# Patient Record
Sex: Male | Born: 1960 | Race: Black or African American | Hispanic: No | Marital: Married | Smoking: Never smoker
Health system: Southern US, Community
[De-identification: ages and names within clinical notes are randomized; demographics above are authoritative.]

## PROBLEM LIST (undated history)

## (undated) DIAGNOSIS — D509 Iron deficiency anemia, unspecified: Secondary | ICD-10-CM

## (undated) DIAGNOSIS — Z8639 Personal history of other endocrine, nutritional and metabolic disease: Secondary | ICD-10-CM

## (undated) DIAGNOSIS — I10 Essential (primary) hypertension: Secondary | ICD-10-CM

## (undated) DIAGNOSIS — D649 Anemia, unspecified: Secondary | ICD-10-CM

## (undated) HISTORY — PX: COLONOSCOPY: SHX174

## (undated) HISTORY — DX: Essential (primary) hypertension: I10

## (undated) HISTORY — DX: Personal history of other endocrine, nutritional and metabolic disease: Z86.39

## (undated) HISTORY — DX: Iron deficiency anemia, unspecified: D50.9

## (undated) HISTORY — DX: Anemia, unspecified: D64.9

---

## 2001-03-16 ENCOUNTER — Encounter: Payer: Self-pay | Admitting: Family Medicine

## 2001-03-16 ENCOUNTER — Ambulatory Visit (HOSPITAL_COMMUNITY): Admission: RE | Admit: 2001-03-16 | Discharge: 2001-03-16 | Payer: Self-pay | Admitting: Family Medicine

## 2006-07-14 ENCOUNTER — Ambulatory Visit (HOSPITAL_COMMUNITY): Admission: RE | Admit: 2006-07-14 | Discharge: 2006-07-14 | Payer: Self-pay | Admitting: Family Medicine

## 2010-06-02 ENCOUNTER — Ambulatory Visit (HOSPITAL_COMMUNITY): Admission: RE | Admit: 2010-06-02 | Discharge: 2010-06-02 | Payer: Self-pay | Admitting: Family Medicine

## 2010-11-18 ENCOUNTER — Ambulatory Visit (INDEPENDENT_AMBULATORY_CARE_PROVIDER_SITE_OTHER): Payer: 59 | Admitting: Internal Medicine

## 2010-11-18 DIAGNOSIS — D509 Iron deficiency anemia, unspecified: Secondary | ICD-10-CM

## 2010-11-30 NOTE — Consult Note (Signed)
NAMESAFI, CULOTTA            ACCOUNT NO.:  [id number]  MEDICAL RECORD NO.:  [id number]          PATIENT TYPE:  OUT  LOCATION:  [facility]DC                           FACILITY:  [facility]  PHYSICIAN:  Lionel December, M.D.    DATE OF BIRTH:  October 08, 1960  DATE OF CONSULTATION: DATE OF DISCHARGE:  06/02/2010                                CONSULTATION   REASON FOR CONSULTATION:  Anemia/TCS.  HISTORY OF PRESENT ILLNESS:  Mr. Ohman is a 50 year old black male referred to our office by Dr. Phillips Odor.  He was recently noted in February that his hemoglobin was slightly low at 11.9 and hematocrit was 36.5, his folate was low at 2.8.  His ferritin was normal at 97, percent saturation was low at 17.  Iron was 54, TIBC was 311, a new IBC was 257. Kimari states his appetite has been good.  There has been no weight loss.  He denies any abdominal pain.  He usually has a bowel movement every day.  His stools are brown.  There has been no change in the size. He denies any melena or bright red rectal bleeding.  He states he is not having any problems.  There is no dysphagia.  No weight loss.  He occasionally has a heartburn.  There is no family history of colon cancer.  October 06, 2010, his hemoglobin was 11.9, HCT 36.5, his MCV was 90.3.  ALLERGIES:  He is allergic to SULFA.  PAST SURGICAL HISTORY:  He has had a left ganglion cyst removed by Dr. Melvern Sample many years ago and he has had a previous cystoscopy.  MEDICAL HISTORY:  He has hypertension.  HOME MEDICATIONS:  Lisinopril 2.5 twice a day, Norvasc 20 mg a day, folate one a day.  FAMILY HISTORY:  His mother is alive with diabetes.  Father is deceased from leukemia.  Two sisters, one has diabetes, one has arthritis.  Four brothers, one has COPD and three in good health.  He is married.  He works at [employer] in [place].  He does not smoke or do drugs. He occasionally drinks alcohol.  He has three children in good health, one, however, does  have depression.  OBJECTIVE:  VITAL SIGNS:  His weight is 217 lb. his height is 6 feet 1 inch, temperature is 97.4, his blood pressure is 124/92, his pulse is 68. HEENT:  He has natural teeth.  His oral mucosa is moist.  There are no lesions.  His conjunctiva is pink.  His sclera is anicteric. NECK:  His thyroid is normal.  There is no cervical lymphadenopathy. LUNGS:  Clear. ABDOMEN:  Soft.  Bowel sounds are positive.  No masses.  I have deferred guaiac in his stools as he will be scheduled for colonoscopy.  ASSESSMENT:  Mr. Ellenbecker is a 50 year old male presenting with very mild anemia but he has no GI symptoms. He is approaching 50. RECOMMENDATIONS:  We will schedule a diagnostic/screening colonoscopy on Mr. Lant.  The risk and benefits were reviewed with the patient and he is agreeable to proceed with the procedure.    ______________________________ Dorene Ar, NP   ______________________________ Lionel December, M.D.  TS/MEDQ  D:  11/18/2010  T:  11/18/2010  Job:  [id number]  Electronically Signed by Dorene Ar PA on 11/25/2010 08:28:01 AM Electronically Signed by Lionel December M.D. on 11/30/2010 12:42:23 PM

## 2011-01-15 ENCOUNTER — Ambulatory Visit (HOSPITAL_COMMUNITY)
Admission: RE | Admit: 2011-01-15 | Discharge: 2011-01-15 | Disposition: A | Discharge status: Home / Self Care | Payer: 59 | Source: Ambulatory Visit | Attending: Internal Medicine | Admitting: Internal Medicine

## 2011-01-15 ENCOUNTER — Encounter (HOSPITAL_BASED_OUTPATIENT_CLINIC_OR_DEPARTMENT_OTHER): Payer: 59 | Admitting: Internal Medicine

## 2011-01-15 ENCOUNTER — Other Ambulatory Visit (INDEPENDENT_AMBULATORY_CARE_PROVIDER_SITE_OTHER): Payer: Self-pay | Admitting: Internal Medicine

## 2011-01-15 DIAGNOSIS — D126 Benign neoplasm of colon, unspecified: Secondary | ICD-10-CM | POA: Insufficient documentation

## 2011-01-15 DIAGNOSIS — I10 Essential (primary) hypertension: Secondary | ICD-10-CM | POA: Insufficient documentation

## 2011-01-15 DIAGNOSIS — Z1211 Encounter for screening for malignant neoplasm of colon: Secondary | ICD-10-CM

## 2011-01-15 DIAGNOSIS — D649 Anemia, unspecified: Secondary | ICD-10-CM

## 2011-01-15 DIAGNOSIS — Z79899 Other long term (current) drug therapy: Secondary | ICD-10-CM | POA: Insufficient documentation

## 2011-01-15 LAB — HEMOGLOBIN AND HEMATOCRIT, BLOOD
HCT: 37.2 % — ABNORMAL LOW (ref 39.0–52.0)
Hemoglobin: 12.1 g/dL — ABNORMAL LOW (ref 13.0–17.0)

## 2011-02-09 NOTE — Op Note (Signed)
  NAMEARCHER, MOIST            ACCOUNT NO.:  [id number]  MEDICAL RECORD NO.:  [id number]           PATIENT TYPE:  O  LOCATION:  [facility]                          FACILITY:  [facility]  PHYSICIAN:  Lionel December, M.D.    DATE OF BIRTH:  November 08, 1960  DATE OF PROCEDURE:  01/15/2011 DATE OF DISCHARGE:                              OPERATIVE REPORT   PROCEDURE:  Colonoscopy.  INDICATION:  Jeff Liu is a 50 year old African American male soon to be 67, who was under colonoscopy primarily for screening purposes.  He was recently evaluated for anemia.  His ferritin was normal, but his iron saturation was slow and he also had a folate deficiency.  There is no history of melena or rectal bleeding and he does not use any NSAIDs.  He uses NSAIDs occasionally.  FAMILY HISTORY:  Negative for colorectal carcinoma.  Procedure risks were reviewed with the patient and informed consent was obtained.  MEDICATIONS FOR CONSCIOUS SEDATION: 1. Demerol 50 mg IV. 2. Versed 6 mg IV.  FINDINGS:  Procedure performed in endoscopy suite.  The patient's vital signs and O2 sats were monitored during the procedure and remained stable.  The patient was placed in left lateral recumbent position and rectal examination performed.  No abnormality noted on external or digital exam.  Pentax videoscope was placed through rectum and advanced under vision into sigmoid colon beyond.  Preparation was excellent. Scope was passed into cecum which was identified by appendiceal orifice and ileocecal valve.  Pictures were taken for the record.  As the scope was withdrawn, colonic mucosa was carefully examined.  There was a 3-4 mm polyp at proximal transverse colon which was ablated via cold biopsy. There was another small polyp at distal transverse colon which was ablated via cold biopsy and submitted in same container.  Mucosa and rest of the colon was normal.  Rectal mucosa was normal.  Scope was retroflexed to examine anorectal  junction which was unremarkable. Endoscope was withdrawn.  Withdrawal time was 16 minutes.  The patient tolerated the procedure well.  FINAL DIAGNOSES: 1. Examination performed to cecum. 2. Two small polyps ablated via cold biopsy from the transverse colon     and submitted in one container.  RECOMMENDATIONS:  We will check his H and H today.  He will resume his usual meds and diet. I will be contacting him with results of biopsy and H and H.          ______________________________ Lionel December, M.D.     NR/MEDQ  D:  01/15/2011  T:  01/15/2011  Job:  [id number]  cc:   Dr. Phillips Odor  Electronically Signed by Lionel December M.D. on 02/09/2011 10:19:24 AM

## 2011-04-24 ENCOUNTER — Encounter (HOSPITAL_COMMUNITY): Payer: Self-pay | Admitting: Oncology

## 2011-04-24 ENCOUNTER — Encounter (HOSPITAL_COMMUNITY): Payer: 59 | Attending: Oncology | Admitting: Oncology

## 2011-04-24 VITALS — BP 130/82 | HR 61 | Temp 98.6°F | Ht 73.0 in | Wt 212.1 lb

## 2011-04-24 DIAGNOSIS — I10 Essential (primary) hypertension: Secondary | ICD-10-CM | POA: Insufficient documentation

## 2011-04-24 DIAGNOSIS — D649 Anemia, unspecified: Secondary | ICD-10-CM | POA: Insufficient documentation

## 2011-04-24 DIAGNOSIS — E538 Deficiency of other specified B group vitamins: Secondary | ICD-10-CM | POA: Insufficient documentation

## 2011-04-24 LAB — CBC
HCT: 37.5 % — ABNORMAL LOW (ref 39.0–52.0)
Hemoglobin: 12.2 g/dL — ABNORMAL LOW (ref 13.0–17.0)
MCHC: 32.5 g/dL (ref 30.0–36.0)
MCV: 90.6 fL (ref 78.0–100.0)
WBC: 6.9 10*3/uL (ref 4.0–10.5)

## 2011-04-24 LAB — DIFFERENTIAL
Lymphocytes Relative: 32 % (ref 12–46)
Monocytes Absolute: 0.7 10*3/uL (ref 0.1–1.0)
Monocytes Relative: 10 % (ref 3–12)
Neutro Abs: 3.7 10*3/uL (ref 1.7–7.7)

## 2011-04-24 LAB — COMPREHENSIVE METABOLIC PANEL
Albumin: 4 g/dL (ref 3.5–5.2)
BUN: 13 mg/dL (ref 6–23)
Chloride: 100 mEq/L (ref 96–112)
Creatinine, Ser: 1.31 mg/dL (ref 0.50–1.35)
GFR calc Af Amer: >60 mL/min (ref >60)
GFR calc non Af Amer: 58 mL/min — ABNORMAL LOW (ref >60)
Glucose, Bld: 94 mg/dL (ref 70–99)
Total Bilirubin: 0.9 mg/dL (ref 0.3–1.2)

## 2011-04-24 LAB — URIC ACID: Uric Acid, Serum: 7.2 mg/dL (ref 4.0–7.8)

## 2011-04-24 LAB — RETICULOCYTES
RBC.: 4.14 MIL/uL — ABNORMAL LOW (ref 4.22–5.81)
Retic Ct Pct: 1.5 % (ref 0.4–3.1)

## 2011-04-24 LAB — SEDIMENTATION RATE: Sed Rate: >140 mm/hr — ABNORMAL HIGH (ref 0–16)

## 2011-04-24 NOTE — Patient Instructions (Signed)
[facility]  Discharge Instructions  RECOMMENDATIONS MADE BY THE CONSULTANT AND ANY TEST RESULTS WILL BE SENT TO YOUR REFERRING DOCTOR.      SPECIAL INSTRUCTIONS/FOLLOW-UP:  Lab work today. We will call you if there are any abnormal results. Return to clinic end of November for labs and then to see MD again.   I acknowledge that I have been informed and understand all the instructions given to me and received a copy. I do not have any more questions at this time, but understand that I may call the Specialty Clinic at [facility] at [phone] during business hours should I have any further questions or need assistance in obtaining follow-up care.    __________________________________________  _____________  __________ Signature of Patient or Authorized Representative            Date                   Time    __________________________________________ Nurse's Signature

## 2011-04-24 NOTE — Progress Notes (Signed)
CC:   Jeff Liu, M.D.  DIAGNOSES: 1. Anemia, normocytic, unclear as to etiology. 2. History of possible gout in the left toe. 3. Hypertension. 4. Folic acid deficiency, nicely documented in January 2012 by Dr.     Phillips Odor and associates with a folic acid level of 2.8, and the     deficient range is 0.4 to 3.3 ng/mL.  At that time his hemoglobin     was 11.9 g.  His hematocrit was 36.5%, and his MCV was normal at     90.  White count, platelets were also normal at that time.  CMET     was also unremarkable at that time.  Cholesterol was normal at that     time as well as a PSA. 5. Plantar fasciitis.  This gentleman is here today.  He is 50 years old.  He has been found to be anemic.  His most recent hemoglobin was 12.1 on 01/15/2011. Hematocrit 37.2.  The normal range for a male his age is 41 to 17 gm/dL for the hemoglobin.  He has not lost blood, and he has seen Dr. Karilyn Cota in consultation who found a benign colon polyp.  REVIEW OF SYSTEMS:  Really is negative.  He does not crave ice or have pica.  He could not figure out why he had the folic acid deficiency.  He does not eat, however, a lot of vegetables and salads, at least he did not at the time, and I suspect that was the cause.  He does not drink, does not smoke, otherwise in very good health.  He works out a lot, lifts a lot of [employer].  He does not walk a lot except at work.  He grew up here in the [place] area.  Played football and basketball. Now works for [employer] in [place].  He is accompanied by his wife of many years.  They have children together who are all grown.  Two sons are grown and gone, 1 daughter 52, still at home.  Everybody is healthy.  He has several brothers, 1 of whom has COPD.  His father died of acute leukemia.  Mother still alive and in good health.  He is not aware of anybody with anemia.  He was also tested for sickle cell disease that was negative.  His review of systems  otherwise is absolutely noncontributory.  PHYSICAL EXAMINATION:  Very pleasant gentleman in no acute distress. Looks his stated age.  He is 212 pounds, height 6 feet 1 inches.  His BMI is 28.  His blood pressure 130/82.  He is afebrile.  Skin is warm and dry to the touch.  His respirations are 16 and unlabored, pulse 60 and regular.  His big toe on the left is what was affected by the pain in January which was severe but was never swollen he states, just extremely painful to the point that he could not sleep with the sheet over it, he could not walk on it and Motrin helped take away the pain after several days.  He has new shoes and since then his plantar fascitis which has been an issue for him.  His plantar fasciitis has resolved essentially as long as he wears these good, well-protected shoes he states.  His story for the big toe being involved by gout, however, is really very striking, with tremendous pain, could not walk on it, could not have anything touch it, could not have that sheet over it at night, etc.  Otherwise he has no lymphadenopathy.  No thyromegaly. He is in no acute distress.  Teeth are in good repair.  Tongue is normal in the midline.  Throat is clear.  Pupils equally round and reactive to light.  He has clear lung fields to auscultation and percussion.  Heart: Regular rhythm and rate without murmur, rub or gallop.  Abdomen:  No hepatosplenomegaly or masses.  There was no distension, no obvious ascites.  He has no peripheral edema.  Pulses 1 to 2+ and symmetrical. His joints are really unremarkable today except for slight tenderness to palpation at the first metatarsal phalangeal joint, but again no redness, no swelling.  He looks really quite good, and I am not sure why he is anemic, so I would like to check a sed rate, reticulocytes, liver enzymes again, kidney function again and sed rate to see what we find, including a uric acid and then see him back in 3  months.  I would like to stop the iron, stop the folic acid and see what is he is like in 3 months with a CBC and folic acid.  His laboratory studies in January did not show any evidence of iron deficiency.  We will see him then.  He is fine with this plan, and will go from there.    ______________________________ [name]. [name], MD ESN/MEDQ  D:  04/24/2011  T:  04/24/2011  Job:  [id number]

## 2011-04-24 NOTE — Progress Notes (Signed)
This office note has been dictated.

## 2011-04-25 LAB — IRON AND TIBC
Saturation Ratios: 21 % (ref 20–55)
UIBC: 254 ug/dL (ref 125–400)

## 2011-04-25 LAB — FERRITIN: Ferritin: 85 ng/mL (ref 22–322)

## 2011-04-25 LAB — FOLATE: Folate: 16.2 ng/mL

## 2011-04-28 ENCOUNTER — Other Ambulatory Visit (HOSPITAL_COMMUNITY): Payer: Self-pay | Admitting: Oncology

## 2011-04-28 DIAGNOSIS — D649 Anemia, unspecified: Secondary | ICD-10-CM

## 2011-04-29 ENCOUNTER — Encounter (HOSPITAL_COMMUNITY): Payer: 59 | Attending: Oncology

## 2011-04-29 DIAGNOSIS — D649 Anemia, unspecified: Secondary | ICD-10-CM | POA: Insufficient documentation

## 2011-04-29 LAB — SEDIMENTATION RATE: Sed Rate: 20 mm/hr — ABNORMAL HIGH (ref 0–16)

## 2011-04-29 NOTE — Progress Notes (Signed)
Labs drawn today for esr,ana, crp, rf, spep

## 2011-04-30 ENCOUNTER — Telehealth (HOSPITAL_COMMUNITY): Payer: Self-pay

## 2011-04-30 ENCOUNTER — Encounter (HOSPITAL_COMMUNITY): Payer: Self-pay

## 2011-04-30 LAB — ANA: Anti Nuclear Antibody(ANA): NEGATIVE

## 2011-04-30 NOTE — Telephone Encounter (Signed)
Notified that repeated labs ESR,RF CRP not alarming per Dr. [name].

## 2011-05-01 LAB — PROTEIN ELECTROPHORESIS, SERUM
Albumin ELP: 53.6 % — ABNORMAL LOW (ref 55.8–66.1)
Beta Globulin: 5.8 % (ref 4.7–7.2)
Total Protein ELP: 7.8 g/dL (ref 6.0–8.3)

## 2011-05-12 ENCOUNTER — Telehealth (HOSPITAL_COMMUNITY): Payer: Self-pay

## 2011-05-12 NOTE — Telephone Encounter (Signed)
Patient called to inform of lab results and treatment plan.  Return appointment confirmed.

## 2011-07-24 ENCOUNTER — Other Ambulatory Visit (HOSPITAL_COMMUNITY): Payer: 59

## 2011-07-24 ENCOUNTER — Encounter (HOSPITAL_COMMUNITY): Payer: 59 | Attending: Oncology

## 2011-07-24 DIAGNOSIS — D649 Anemia, unspecified: Secondary | ICD-10-CM | POA: Insufficient documentation

## 2011-07-24 LAB — CBC
Platelets: 252 10*3/uL (ref 150–400)
RDW: 13.7 % (ref 11.5–15.5)
WBC: 4.6 10*3/uL (ref 4.0–10.5)

## 2011-07-24 LAB — FOLATE: Folate: >20 ng/mL

## 2011-07-24 NOTE — Progress Notes (Signed)
Jeff Liu presented for labwork. Labs per MD order drawn via Peripheral Line 25 gauge needle inserted in rt arm  Good blood return present. Procedure without incident.  Needle removed intact. Patient tolerated procedure well.

## 2011-07-28 ENCOUNTER — Ambulatory Visit (HOSPITAL_COMMUNITY): Payer: 59 | Admitting: Oncology

## 2011-07-30 ENCOUNTER — Encounter (HOSPITAL_COMMUNITY): Payer: Self-pay | Admitting: Oncology

## 2011-07-30 ENCOUNTER — Encounter (HOSPITAL_COMMUNITY): Payer: 59 | Attending: Oncology | Admitting: Oncology

## 2011-07-30 VITALS — BP 137/98 | HR 61 | Temp 97.4°F | Wt 215.3 lb

## 2011-07-30 DIAGNOSIS — D649 Anemia, unspecified: Secondary | ICD-10-CM

## 2011-07-30 DIAGNOSIS — I10 Essential (primary) hypertension: Secondary | ICD-10-CM

## 2011-07-30 DIAGNOSIS — Z8639 Personal history of other endocrine, nutritional and metabolic disease: Secondary | ICD-10-CM

## 2011-07-30 HISTORY — DX: Essential (primary) hypertension: I10

## 2011-07-30 HISTORY — DX: Anemia, unspecified: D64.9

## 2011-07-30 HISTORY — DX: Personal history of other endocrine, nutritional and metabolic disease: Z86.39

## 2011-07-30 NOTE — Patient Instructions (Signed)
[facility]  Discharge Instructions  RECOMMENDATIONS MADE BY THE CONSULTANT AND ANY TEST RESULTS WILL BE SENT TO YOUR REFERRING DOCTOR.   EXAM FINDINGS BY MD TODAY AND SIGNS AND SYMPTOMS TO REPORT TO CLINIC OR PRIMARY MD:   Exam good.  Labs 3 months and then see Dr. [name].  I acknowledge that I have been informed and understand all the instructions given to me and received a copy. I do not have any more questions at this time, but understand that I may call the Specialty Clinic at [facility] at [phone] during business hours should I have any further questions or need assistance in obtaining follow-up care.    __________________________________________  _____________  __________ Signature of Patient or Authorized Representative            Date                   Time    __________________________________________ Nurse's Signature

## 2011-07-30 NOTE — Progress Notes (Signed)
Jeff Ribas, MD [address] [place] [address]  1. Anemia  amLODipine (NORVASC) 2.5 MG tablet, CBC, Differential, Sedimentation rate, Folate, Ferritin, Iron and TIBC, Reticulocytes  2. Normocytic anemia    3. HTN (hypertension)    4. History of folic acid deficiency (non-anemic)      CURRENT THERAPY: Observation  INTERVAL HISTORY: Jeff Liu 50 y.o. male returns for  regular  visit for followup of normocytic anemia of unclear etiology.  On his previous followup visit, Dr. [name] recommended he place his iron and folic acid pills on hold.  The patient reports that he did but his iron pills on hold but continued is folic acid.  I've asked him today to discontinue his folic acid as well.  Otherwise the patient is doing well. He denies any complaints at this point time.  I personally reviewed and went over laboratory results with the patient.  His hemoglobin remains stable, and if anything has improved since May of 2012. Hemoglobin was recently his 12.4. Specimen time patient going over the role of hemoglobin in the body. He understands that lower limits of normal is 13 and he is nearly there. As result the hemoglobin 12.4 should not cause any increased shortness of breath or fatigue.  Patient denies seeing any blood in his stool. He denies any black tarry stool. He denies any hematuria. Denies any epistaxis. He denies any spontaneous bleeding or easy bruisability.   Past Medical History  Diagnosis Date  . Hypertension   . Iron deficiency anemia   . Normocytic anemia 07/30/2011  . HTN (hypertension) 07/30/2011  . History of folic acid deficiency (non-anemic) 07/30/2011    has Normocytic anemia; HTN (hypertension); and History of folic acid deficiency (non-anemic) on his problem list.     is allergic to sulfa drugs cross reactors.  Mr. Monks does not currently have medications on file.  No past surgical history on file.  Ddenies any  headaches, dizziness, double vision, fevers, chills, night sweats, nausea, vomiting, diarrhea, constipation, chest pain, heart palpitations, shortness of breath, blood in stool, black tarry stool, urinary pain, urinary burning, urinary frequency, hematuria.   PHYSICAL EXAMINATION  ECOG PERFORMANCE STATUS: 0 - Asymptomatic  Filed Vitals:   07/30/11 1200  BP: 137/98  Pulse: 61  Temp: 97.4 F (36.3 C)    GENERAL:alert, no distress, well nourished, well developed, comfortable, cooperative and smiling SKIN: skin color, texture, turgor are normal HEAD: Normocephalic EYES: normal EARS: External ears normal OROPHARYNX:mucous membranes are moist  NECK: trachea midline LYMPH:  not examined BREAST:not examined LUNGS: clear to auscultation  HEART: regular rate & rhythm, no murmurs, no gallops, S1 normal and S2 normal ABDOMEN:abdomen soft, non-tender and normal bowel sounds BACK: Back symmetric, no curvature. EXTREMITIES:less then 2 second capillary refill, no joint deformities, effusion, or inflammation, no edema, no skin discoloration, no clubbing, no cyanosis  NEURO: alert & oriented x 3 with fluent speech, no focal motor/sensory deficits, gait normal   LABORATORY DATA: CBC    Component Value Date/Time   WBC 4.6 07/24/2011 0848   RBC 4.12* 07/24/2011 0848   HGB 12.4* 07/24/2011 0848   HCT 37.6* 07/24/2011 0848   PLT 252 07/24/2011 0848   MCV 91.3 07/24/2011 0848   MCH 30.1 07/24/2011 0848   MCHC 33.0 07/24/2011 0848   RDW 13.7 07/24/2011 0848   LYMPHSABS 2.2 04/24/2011 1517   MONOABS 0.7 04/24/2011 1517   EOSABS 0.3 04/24/2011 1517   BASOSABS 0.1 04/24/2011 1517  Chemistry      Component Value Date/Time   NA 140 04/24/2011 1517   K 3.7 04/24/2011 1517   CL 100 04/24/2011 1517   CO2 31 04/24/2011 1517   BUN 13 04/24/2011 1517   CREATININE 1.31 04/24/2011 1517      Component Value Date/Time   CALCIUM 9.9 04/24/2011 1517   ALKPHOS 61 04/24/2011 1517   AST 23 04/24/2011 1517     ALT 15 04/24/2011 1517   BILITOT 0.9 04/24/2011 1517         ASSESSMENT:  1. Anemia, normocytic, unclear as to etiology.  2. History of possible gout in the left toe.  3. Hypertension.  4. Folic acid deficiency, nicely documented in January 2012 by Dr. Phillips Odor and associates with a folic acid level of 2.8, and the deficient range is 0.4 to 3.3 ng/mL. At that time his hemoglobin was 11.9 g. His hematocrit was 36.5%, and his MCV was normal at 90. White count, platelets were also normal at that time. CMET was also unremarkable at that time. Cholesterol was normal at that time as well as a PSA.  5. Plantar fasciitis.   PLAN:  1. Hold oral Iron 2. Hold Folic acid 3. Lab work in 3 months: CBC diff, Retic, Ferritin, Iron/TIBC, ESR, CMET 4. Return in 3 months for follow-up.   All questions were answered. The patient knows to call the clinic with any problems, questions or concerns. We can certainly see the patient much sooner if necessary.   I spent 20 minutes counseling the patient face to face. The total time spent in the appointment was 25 minutes.  KEFALAS,THOMAS

## 2011-10-27 ENCOUNTER — Other Ambulatory Visit (HOSPITAL_COMMUNITY): Payer: 59

## 2011-10-30 ENCOUNTER — Encounter (HOSPITAL_COMMUNITY): Payer: 59 | Attending: Oncology | Admitting: Oncology

## 2011-10-30 ENCOUNTER — Ambulatory Visit (HOSPITAL_COMMUNITY): Payer: 59 | Admitting: Oncology

## 2011-10-30 ENCOUNTER — Encounter (HOSPITAL_COMMUNITY): Payer: Self-pay | Admitting: Oncology

## 2011-10-30 ENCOUNTER — Telehealth (HOSPITAL_COMMUNITY): Payer: Self-pay

## 2011-10-30 VITALS — BP 151/89 | HR 56 | Temp 98.0°F | Wt 216.0 lb

## 2011-10-30 DIAGNOSIS — M79609 Pain in unspecified limb: Secondary | ICD-10-CM

## 2011-10-30 DIAGNOSIS — D649 Anemia, unspecified: Secondary | ICD-10-CM | POA: Insufficient documentation

## 2011-10-30 LAB — CBC
HCT: 36.5 % — ABNORMAL LOW (ref 39.0–52.0)
Hemoglobin: 12.1 g/dL — ABNORMAL LOW (ref 13.0–17.0)
MCH: 30 pg (ref 26.0–34.0)
RBC: 4.04 MIL/uL — ABNORMAL LOW (ref 4.22–5.81)

## 2011-10-30 LAB — SEDIMENTATION RATE: Sed Rate: 27 mm/hr — ABNORMAL HIGH (ref 0–16)

## 2011-10-30 NOTE — Telephone Encounter (Signed)
Patient notified.  Knows to call if unusual symptoms occur.

## 2011-10-30 NOTE — Patient Instructions (Signed)
Jeff Liu  [id number] 1960/12/02   [facility]  Discharge Instructions  RECOMMENDATIONS MADE BY THE CONSULTANT AND ANY TEST RESULTS WILL BE SENT TO YOUR REFERRING DOCTOR.   EXAM FINDINGS BY MD TODAY AND SIGNS AND SYMPTOMS TO REPORT TO CLINIC OR PRIMARY MD: Hemoglobin and Hematocrit are the same as in November.  We will check some labs today.  Let us know if you have any unusual symptoms and if you have this pain and have any swelling in your toes, feet try to come in so we can see you.  MEDICATIONS PRESCRIBED: none     SPECIAL INSTRUCTIONS/FOLLOW-UP: Lab work Needed today and Return to Clinic in 3 months to see MD.   I acknowledge that I have been informed and understand all the instructions given to me and received a copy. I do not have any more questions at this time, but understand that I may call the Specialty Clinic at [facility] at [phone] during business hours should I have any further questions or need assistance in obtaining follow-up care.    __________________________________________  _____________  __________ Signature of Patient or Authorized Representative            Date                   Time    __________________________________________ Nurse's Signature

## 2011-10-30 NOTE — Progress Notes (Signed)
CC:   Corrie Mckusick, M.D.  DIAGNOSIS: 1. Normocytic anemia with an intermittently elevated sedimentation     rate and CRP. 2. Folic acid deficiency which has been corrected. 3. Some weakness especially after he does lunges in his workout. He     can do 20 lunges and gets very tired.  Otherwise he does a lot of     weights and  other fitness. 4. Left big toe pain for 5-7 days, treated with anti-inflammatories on     his own with eventual resolution.  No recurrence and he was told he     either had gout or plantar fascitis but really was in the big toe     at the 1st metatarsal phalangeal joint.  He has not had a     recurrence.  HISTORY:  Emit is working full time.  No fevers.  No night sweats. No chills.  Good appetite.  Good activity level.  He does tell me about the lunges and how he gets fatigue after.  His sense of well being, though, is good.  So he does not have any unusual issues going on.  No unusual complaints on review of systems.  I did not examine him further today since he is asymptomatic, but I do want to review a CBC and a sedimentation rate today.  He did have a CBC on the 1st of February by Dr. Phillips Odor and that showed a hemoglobin 12.4 g, hematocrit 30.1, so he still mildly anemic for a gentleman his age but he had a CMET which was perfectly normal.  He had a lipid panel which was quite good as well.  His total cholesterol is not mentioned but his cholesterol HDL ratio is 3.4, HDL 60.  I do want to see him in 3 months.  I am not sure what is going on with this gentleman.  If he has anything untoward going on in the future, he is to come back and see me but right now we will just get a CBC and sedimentation rate today but I will see him in 3 months.    ______________________________ [name]. [name], MD ESN/MEDQ  D:  10/30/2011  T:  10/30/2011  Job:  [id number]

## 2011-10-30 NOTE — Telephone Encounter (Signed)
Message copied by Sterling Big on Fri Oct 30, 2011 12:41 PM ------      Message from: [name], ERIC S      Created: Fri Oct 30, 2011 11:51 AM       Let him know his cbc is stable but sed rate min. Raised-still not clear why.

## 2011-10-30 NOTE — Progress Notes (Signed)
This office note has been dictated.

## 2011-12-04 ENCOUNTER — Telehealth (HOSPITAL_COMMUNITY): Payer: Self-pay

## 2011-12-04 NOTE — Telephone Encounter (Signed)
Patient is traveling and will not be back in [place] until later tonight so cannot come today for blood work.  Wants to know if he can start the aleve and come for blood work on Monday?  Has had this discomfort all week.

## 2011-12-04 NOTE — Telephone Encounter (Signed)
Spoke with patient and told him he could take the aleve as recommended by Dr. [name] and to let us know on Monday how he's doing.  Patient states that he's already been taking Aleve for sinus problem and will continue with that instead.  He will contact our office on Monday to let us know how he's doing.

## 2011-12-31 IMAGING — CR DG FOOT COMPLETE 3+V*L*
3 series · 3 of 3 positions shown · non-contrast
Comparison: None

CLINICAL DATA: Pain left great toe and left heel

LEFT FOOT - COMPLETE 3+ VIEW

[view not recorded (1 of 3)]
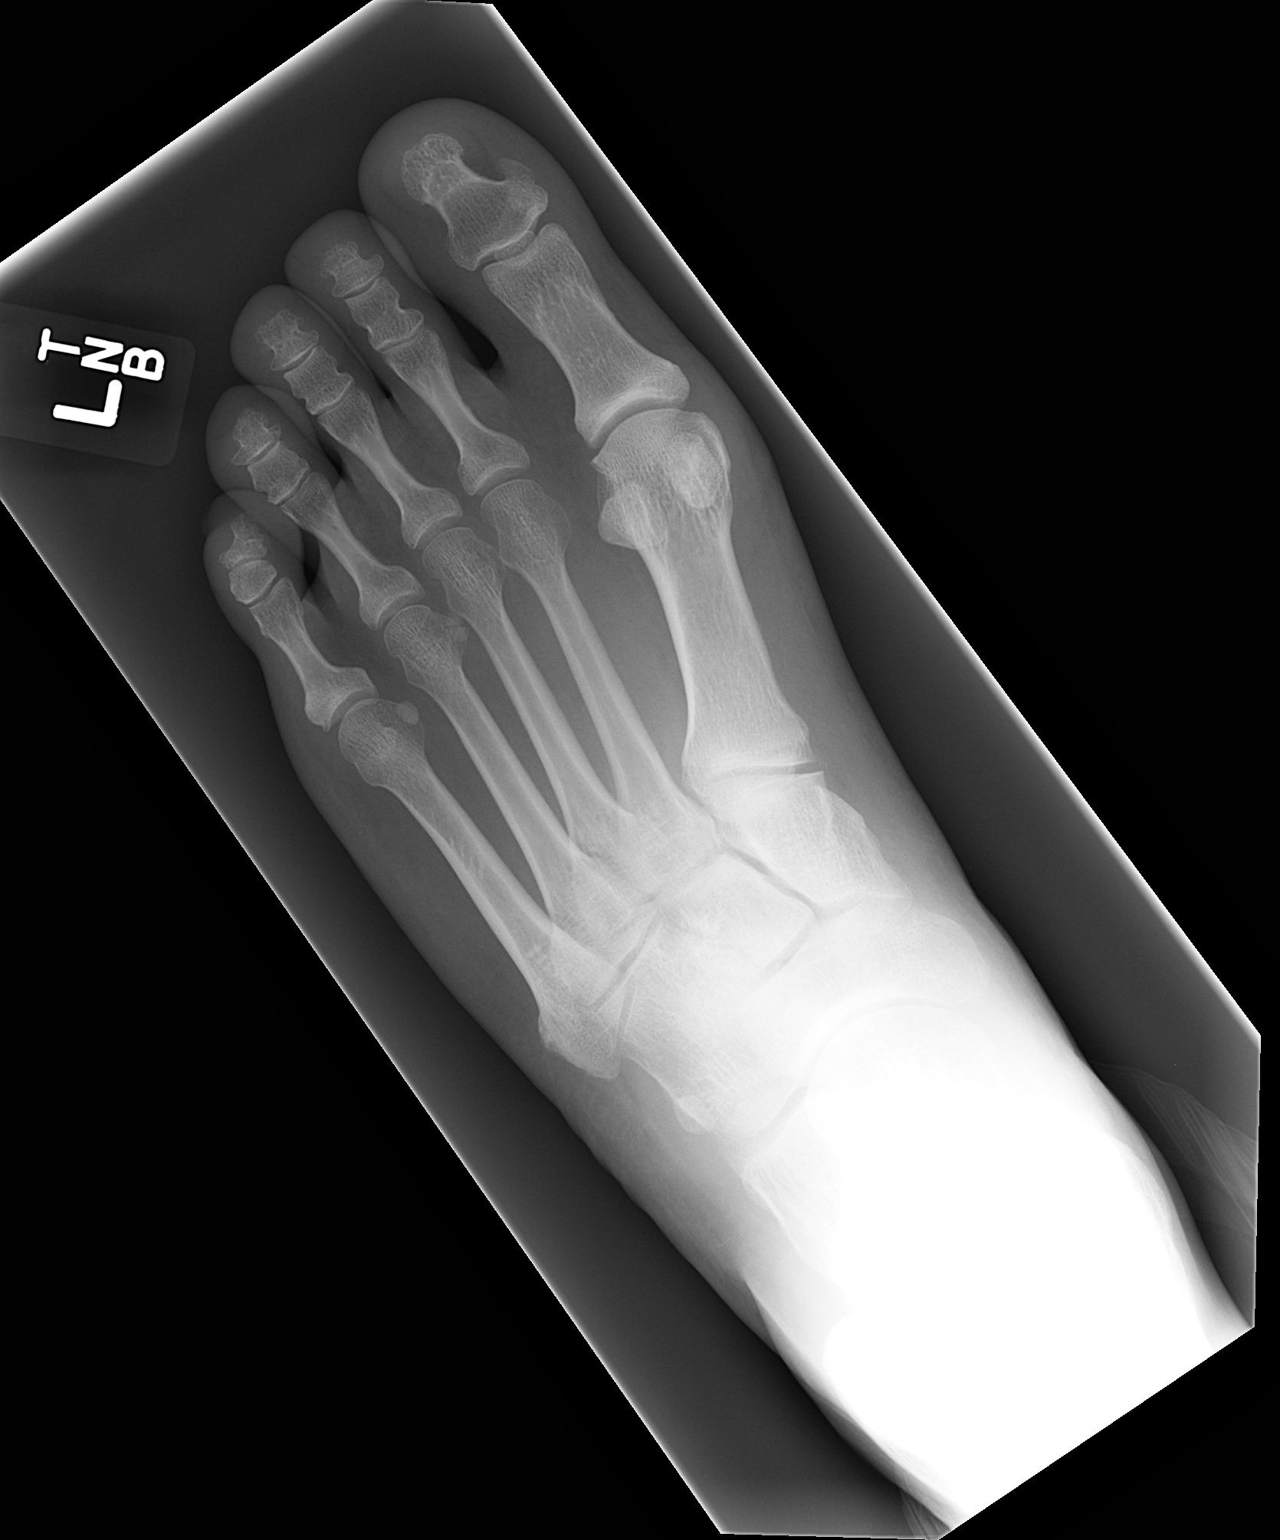

[view not recorded (2 of 3)]
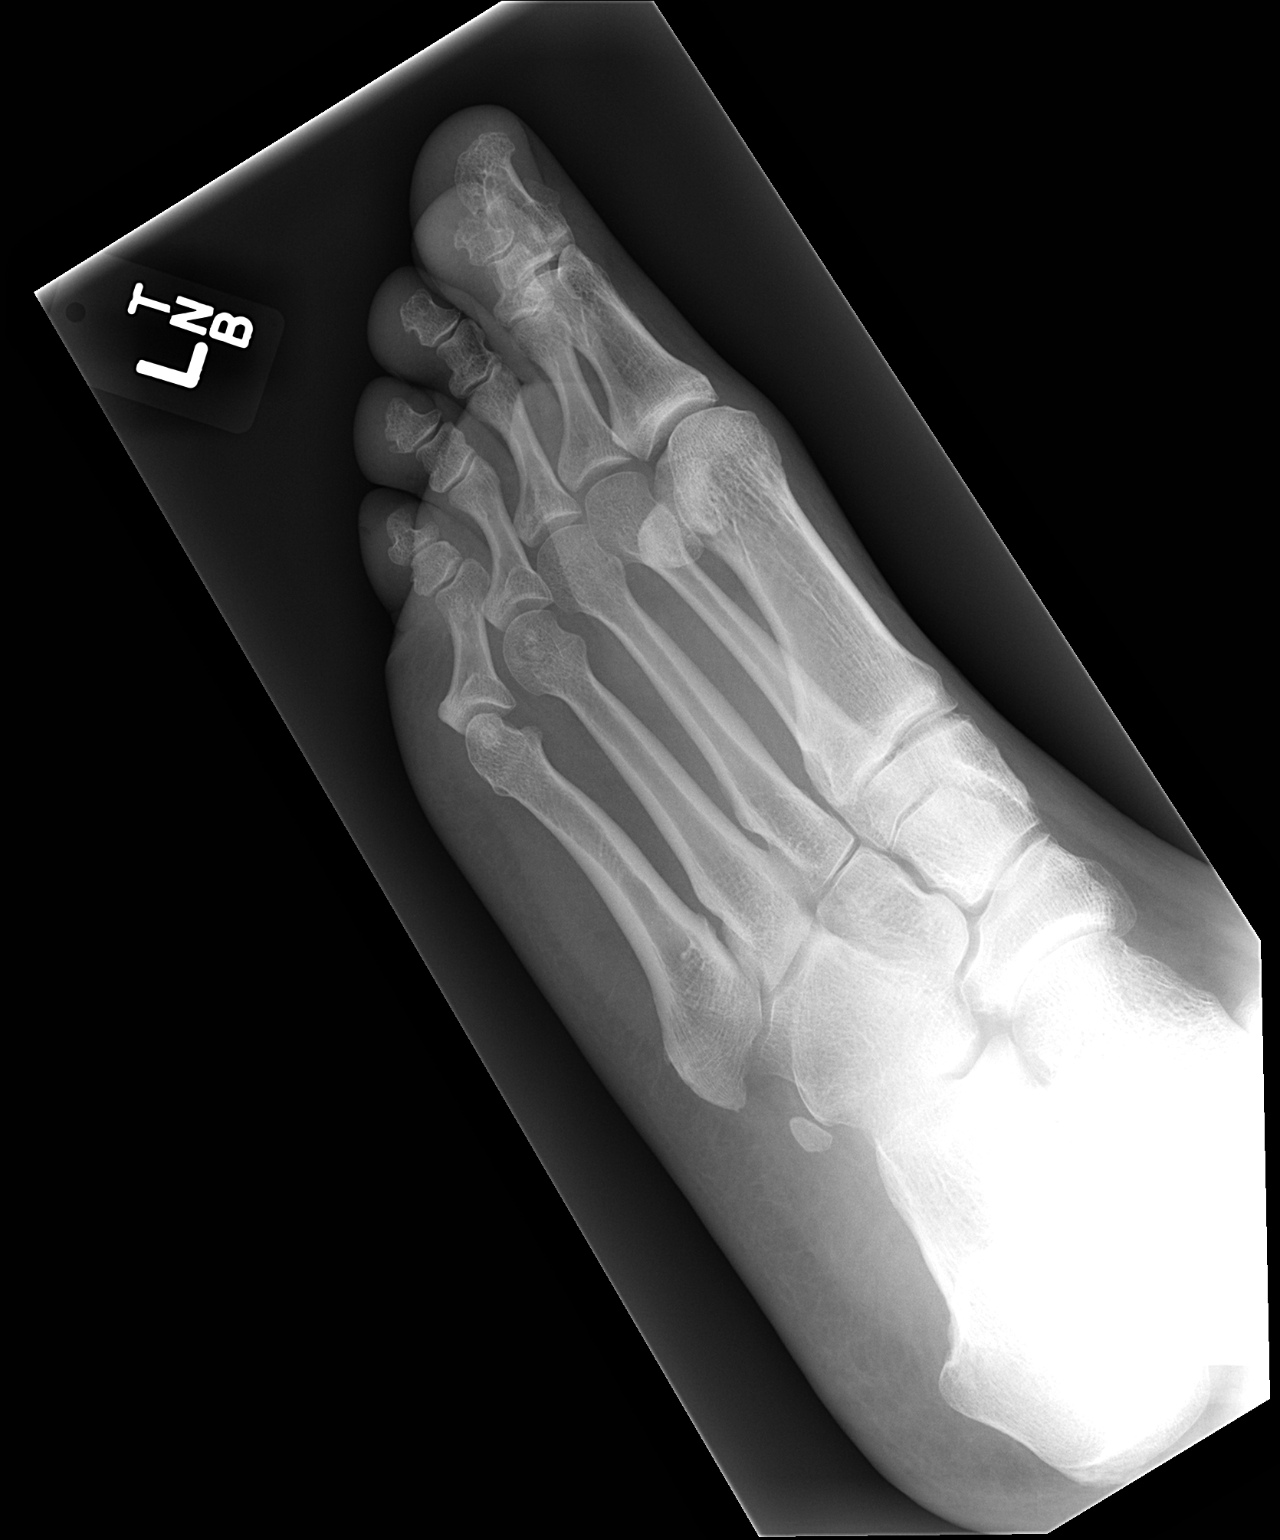

[view not recorded (3 of 3)]
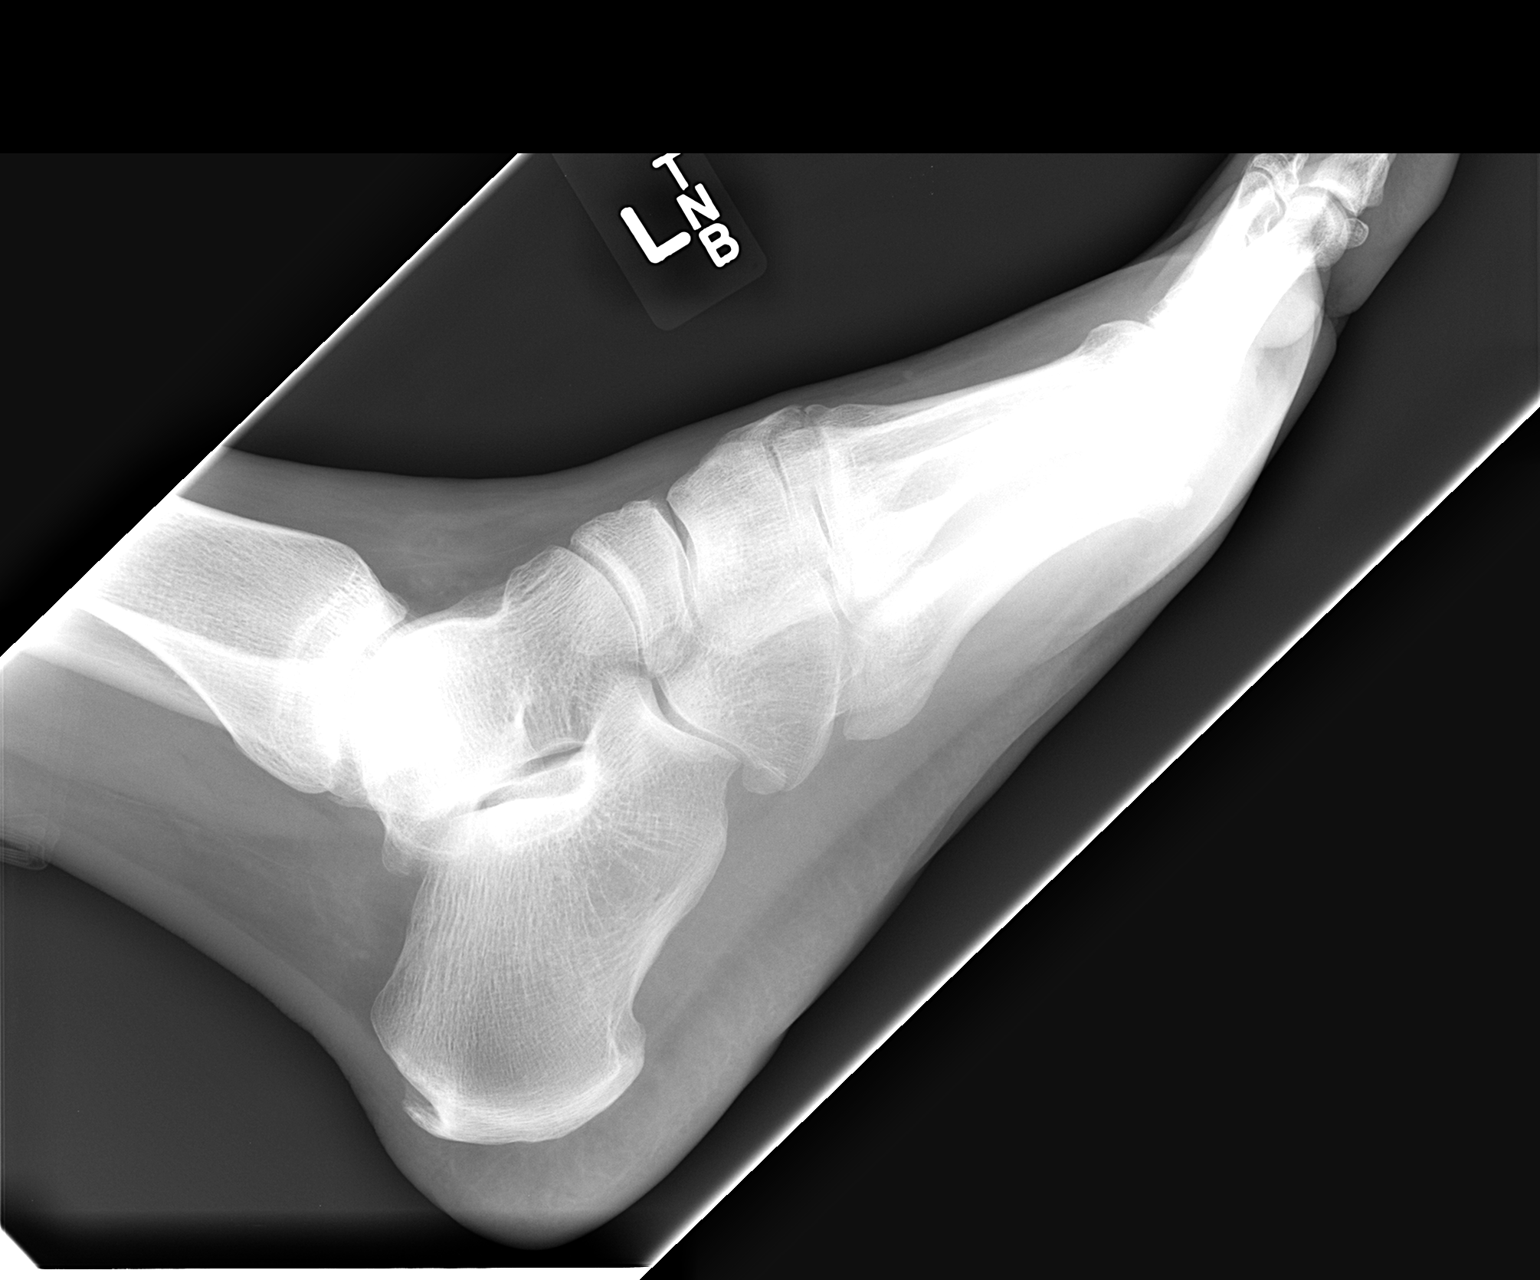

[3 of 3 positions shown; findings below may reference images not displayed]

FINDINGS: Small Achilles insertion calcaneal spur.
Osseous mineralization normal.
Joint spaces preserved.
No acute fracture, dislocation, or bone destruction.
Soft tissues unremarkable.
IMPRESSION: Small calcaneal spur at Achilles tendon insertion.
No acute osseous abnormalities.

## 2012-02-09 ENCOUNTER — Ambulatory Visit (HOSPITAL_COMMUNITY): Payer: 59 | Admitting: Oncology

## 2012-12-02 ENCOUNTER — Emergency Department (HOSPITAL_COMMUNITY)
Admission: EM | Admit: 2012-12-02 | Discharge: 2012-12-03 | Disposition: A | Discharge status: Home / Self Care | Payer: 59 | Attending: Emergency Medicine | Admitting: Emergency Medicine

## 2012-12-02 ENCOUNTER — Encounter (HOSPITAL_COMMUNITY): Payer: Self-pay | Admitting: *Deleted

## 2012-12-02 DIAGNOSIS — I10 Essential (primary) hypertension: Secondary | ICD-10-CM | POA: Insufficient documentation

## 2012-12-02 DIAGNOSIS — D509 Iron deficiency anemia, unspecified: Secondary | ICD-10-CM | POA: Insufficient documentation

## 2012-12-02 DIAGNOSIS — Z87891 Personal history of nicotine dependence: Secondary | ICD-10-CM | POA: Insufficient documentation

## 2012-12-02 DIAGNOSIS — Z79899 Other long term (current) drug therapy: Secondary | ICD-10-CM | POA: Insufficient documentation

## 2012-12-02 DIAGNOSIS — R42 Dizziness and giddiness: Secondary | ICD-10-CM

## 2012-12-02 LAB — COMPREHENSIVE METABOLIC PANEL
Alkaline Phosphatase: 67 U/L (ref 39–117)
BUN: 11 mg/dL (ref 6–23)
Chloride: 100 mEq/L (ref 96–112)
GFR calc Af Amer: 85 mL/min — ABNORMAL LOW (ref >90)
GFR calc non Af Amer: 74 mL/min — ABNORMAL LOW (ref >90)
Glucose, Bld: 162 mg/dL — ABNORMAL HIGH (ref 70–99)
Potassium: 3.7 mEq/L (ref 3.5–5.1)
Total Bilirubin: 0.7 mg/dL (ref 0.3–1.2)

## 2012-12-02 LAB — CBC WITH DIFFERENTIAL/PLATELET
Eosinophils Absolute: 0.2 10*3/uL (ref 0.0–0.7)
Hemoglobin: 12.1 g/dL — ABNORMAL LOW (ref 13.0–17.0)
Lymphs Abs: 2.5 10*3/uL (ref 0.7–4.0)
MCH: 30.1 pg (ref 26.0–34.0)
Monocytes Relative: 8 % (ref 3–12)
Neutro Abs: 2.9 10*3/uL (ref 1.7–7.7)
Neutrophils Relative %: 46 % (ref 43–77)
RBC: 4.02 MIL/uL — ABNORMAL LOW (ref 4.22–5.81)

## 2012-12-02 NOTE — ED Notes (Signed)
Passed out around 2100

## 2012-12-02 NOTE — ED Provider Notes (Signed)
History     CSN: [id number]  Arrival date & time 12/02/12  2151   First MD Initiated Contact with Patient 12/02/12 2321      Chief Complaint  Patient presents with  . Near Syncope    (Consider location/radiation/quality/duration/timing/severity/associated sxs/prior treatment) HPI Comments: Patient presents with complaints of dizziness.  He walked out to the car and suddenly became dizzy and fell.  He describes the dizziness as a spinning sensation, like he "was drunk".  This lasted for several minutes then resolved.  He denies any headache or head trauma.  No loc.  No blurry vision.  Now he feels fine.  He is athletic and exercises regularly without any symptoms.    The history is provided by the patient.    Past Medical History  Diagnosis Date  . Hypertension   . Iron deficiency anemia   . Normocytic anemia 07/30/2011  . HTN (hypertension) 07/30/2011  . History of folic acid deficiency (non-anemic) 07/30/2011    Past Surgical History  Procedure Laterality Date  . Colonoscopy      Family History  Problem Relation Age of Onset  . Diabetes Mother   . Cancer Father     leukemia  . Emphysema Brother     History  Substance Use Topics  . Smoking status: Former Games developer  . Smokeless tobacco: Never Used  . Alcohol Use: Yes      Review of Systems  All other systems reviewed and are negative.    Allergies  Sulfa drugs cross reactors  Home Medications   Current Outpatient Rx  Name  Route  Sig  Dispense  Refill  . amLODipine (NORVASC) 2.5 MG tablet   Oral   Take 2.5 mg by mouth daily.           . Ferrous Sulfate 83 MG TABS   Oral   Take 83 mg by mouth daily.           . folic acid (FOLVITE) 1 MG tablet   Oral   Take 800 mcg by mouth daily.           Marland Kitchen ibuprofen (ADVIL,MOTRIN) 200 MG tablet   Oral   Take 200 mg by mouth every 8 (eight) hours as needed.           Marland Kitchen lisinopril (PRINIVIL,ZESTRIL) 20 MG tablet   Oral   Take 20 mg by mouth daily.            BP 150/95  Pulse 49  Temp(Src) 98.3 F (36.8 C) (Oral)  SpO2 100%  Physical Exam  Nursing note and vitals reviewed. Constitutional: He is oriented to person, place, and time. He appears well-developed and well-nourished. No distress.  HENT:  Head: Normocephalic and atraumatic.  Eyes: EOM are normal. Pupils are equal, round, and reactive to light.  Neck: Normal range of motion. Neck supple.  Cardiovascular: Normal rate and regular rhythm.   No murmur heard. Pulmonary/Chest: Effort normal and breath sounds normal. No respiratory distress. He has no wheezes.  Abdominal: Soft. Bowel sounds are normal. He exhibits no distension. There is no tenderness.  Musculoskeletal: Normal range of motion. He exhibits no edema.  Neurological: He is alert and oriented to person, place, and time. No cranial nerve deficit. He exhibits normal muscle tone. Coordination normal.  Skin: Skin is warm and dry. He is not diaphoretic.    ED Course  Procedures (including critical care time)  Labs Reviewed  GLUCOSE, CAPILLARY - Abnormal; Notable for the following:  Glucose-Capillary 162 (*)    All other components within normal limits  CBC WITH DIFFERENTIAL  COMPREHENSIVE METABOLIC PANEL   No results found.   No diagnosis found.   Date: 12/03/2012  Rate: 48  Rhythm: sinus bradycardia  QRS Axis: normal  Intervals: normal  ST/T Wave abnormalities: normal  Conduction Disutrbances:none  Narrative Interpretation:   Old EKG Reviewed: none available     MDM  The patient presents after a dizzy episode at home that sounds vertiginous in nature, likely peripheral.  The labs are unremarkable with the exception of an elevated glucose.  He has no history of DM and was advised to follow this up with his pcp.  The ekg is normal and he otherwise appears well with a non-focal neurological exam.  I believe he is stable for discharge to home, to return prn if he worsens.          Sudie Grumbling, MD 12/03/12 [phone]

## 2012-12-02 NOTE — ED Notes (Signed)
Pt states earlier tonight he became dizzy and "almost passed out". Pt denies any pain or dizziness at this time. Pt denies LOC.

## 2012-12-03 MED ORDER — MECLIZINE HCL 25 MG PO TABS: 25.0000 mg | ORAL_TABLET | Freq: Three times a day (TID) | ORAL | Status: DC | PRN

## 2013-05-17 ENCOUNTER — Encounter: Payer: Self-pay | Admitting: Oncology

## 2014-03-17 ENCOUNTER — Encounter (HOSPITAL_COMMUNITY): Payer: Self-pay | Admitting: Emergency Medicine

## 2014-03-17 ENCOUNTER — Emergency Department (HOSPITAL_COMMUNITY)
Admission: EM | Admit: 2014-03-17 | Discharge: 2014-03-18 | Disposition: A | Discharge status: Home / Self Care | Payer: 59 | Attending: Emergency Medicine | Admitting: Emergency Medicine

## 2014-03-17 DIAGNOSIS — Y9289 Other specified places as the place of occurrence of the external cause: Secondary | ICD-10-CM | POA: Diagnosis not present

## 2014-03-17 DIAGNOSIS — IMO0002 Reserved for concepts with insufficient information to code with codable children: Secondary | ICD-10-CM | POA: Insufficient documentation

## 2014-03-17 DIAGNOSIS — D509 Iron deficiency anemia, unspecified: Secondary | ICD-10-CM | POA: Diagnosis not present

## 2014-03-17 DIAGNOSIS — Z79899 Other long term (current) drug therapy: Secondary | ICD-10-CM | POA: Insufficient documentation

## 2014-03-17 DIAGNOSIS — D649 Anemia, unspecified: Secondary | ICD-10-CM | POA: Diagnosis not present

## 2014-03-17 DIAGNOSIS — T6391XA Toxic effect of contact with unspecified venomous animal, accidental (unintentional), initial encounter: Secondary | ICD-10-CM | POA: Diagnosis present

## 2014-03-17 DIAGNOSIS — I10 Essential (primary) hypertension: Secondary | ICD-10-CM | POA: Diagnosis not present

## 2014-03-17 DIAGNOSIS — Y9389 Activity, other specified: Secondary | ICD-10-CM | POA: Insufficient documentation

## 2014-03-17 DIAGNOSIS — T63461A Toxic effect of venom of wasps, accidental (unintentional), initial encounter: Secondary | ICD-10-CM | POA: Insufficient documentation

## 2014-03-17 MED ORDER — FAMOTIDINE 20 MG PO TABS
20.0000 mg | ORAL_TABLET | Freq: Once | ORAL | Status: AC
  Administered 2014-03-18: 20 mg via ORAL
  Filled 2014-03-17: qty 1

## 2014-03-17 MED ORDER — PREDNISONE 50 MG PO TABS
60.0000 mg | ORAL_TABLET | Freq: Once | ORAL | Status: AC
  Administered 2014-03-18: 60 mg via ORAL
  Filled 2014-03-17 (×2): qty 1

## 2014-03-17 MED ORDER — HYDROXYZINE HCL 25 MG PO TABS
25.0000 mg | ORAL_TABLET | Freq: Once | ORAL | Status: AC
  Administered 2014-03-18: 25 mg via ORAL
  Filled 2014-03-17: qty 1

## 2014-03-17 NOTE — ED Provider Notes (Signed)
CSN: [id number]     Arrival date & time 03/17/14  2328 History   None   This chart was scribed for Merryl Hacker, MD by Terressa Koyanagi, ED Scribe. This patient was seen in room APA05/APA05 and the patient's care was started at 11:43 PM.  Chief Complaint  Patient presents with  . Insect Bite  PCP: Purvis Kilts, MD  HPI HPI Comments: Jeff Liu is a 53 y.o. male, with a history of high blood pressure, who presents to the Emergency Department complaining of a yellow jacket sting to his left hand sustained around 10AM. Pt also complains of associated worsening swelling of his left hand. Pt reports he took a Benadryl around noon without much relief. Pt denies SOB, rash,  or any other Sx.   Past Medical History  Diagnosis Date  . Hypertension   . Iron deficiency anemia   . Normocytic anemia 07/30/2011  . HTN (hypertension) 07/30/2011  . History of folic acid deficiency (non-anemic) 07/30/2011   Past Surgical History  Procedure Laterality Date  . Colonoscopy     Family History  Problem Relation Age of Onset  . Diabetes Mother   . Cancer Father     leukemia  . Emphysema Brother    History  Substance Use Topics  . Smoking status: Never Smoker   . Smokeless tobacco: Never Used  . Alcohol Use: Yes    Review of Systems  Respiratory: Negative for chest tightness and shortness of breath.   Gastrointestinal: Negative for nausea, vomiting and abdominal pain.  Skin: Positive for wound.       Hand swelling  All other systems reviewed and are negative.     Allergies  Sulfa drugs cross reactors  Home Medications   Prior to Admission medications   Medication Sig Start Date End Date Taking? Authorizing Provider  amLODipine (NORVASC) 2.5 MG tablet Take 10 mg by mouth daily.    Yes Historical Provider, MD  ibuprofen (ADVIL,MOTRIN) 200 MG tablet Take 200 mg by mouth every 8 (eight) hours as needed.     Yes Historical Provider, MD  lisinopril (PRINIVIL,ZESTRIL) 20 MG  tablet Take 30 mg by mouth daily.    Yes Historical Provider, MD  famotidine (PEPCID) 20 MG tablet Take 1 tablet (20 mg total) by mouth daily. 03/18/14   Merryl Hacker, MD  Ferrous Sulfate 83 MG TABS Take 83 mg by mouth daily.      Historical Provider, MD  folic acid (FOLVITE) 1 MG tablet Take 800 mcg by mouth daily.      Historical Provider, MD  hydrOXYzine (ATARAX/VISTARIL) 25 MG tablet Take 1 tablet (25 mg total) by mouth every 8 (eight) hours as needed for itching. 03/18/14   Merryl Hacker, MD  meclizine (ANTIVERT) 25 MG tablet Take 1 tablet (25 mg total) by mouth 3 (three) times daily as needed. 12/03/12   Veryl Speak, MD  predniSONE (DELTASONE) 20 MG tablet Take 3 tablets (60 mg total) by mouth daily with breakfast. 03/18/14   Merryl Hacker, MD   Triage Vitals: BP 136/89  Pulse 79  Temp(Src) 98.5 F (36.9 C) (Oral)  Resp 18  Ht 6\' 1"  (1.854 m)  Wt 215 lb (97.523 kg)  BMI 28.37 kg/m2  SpO2 97% Physical Exam  Nursing note and vitals reviewed. Constitutional: He is oriented to person, place, and time. He appears well-developed and well-nourished.  HENT:  Head: Normocephalic and atraumatic.  Cardiovascular: Normal rate, regular rhythm and normal heart sounds.  No murmur heard. Pulmonary/Chest: Effort normal and breath sounds normal. No respiratory distress. He has no wheezes.  Musculoskeletal:  Focused examination of the left hand reveals diffuse swelling over the dorsum of the hand, sting site noted in the dorsal aspect of the proximal first digit, no significant erythema, no associated rash, neurovascularly intact with good range of motion  Neurological: He is alert and oriented to person, place, and time.  Skin: Skin is warm and dry. No rash noted.  Psychiatric: He has a normal mood and affect.    ED Course  Procedures (including critical care time) DIAGNOSTIC STUDIES: Oxygen Saturation is 97% on RA, normal by my interpretation.    COORDINATION OF CARE: 11:46  PM-Discussed treatment plan which includes meds and cool compresses with pt at bedside and pt agreed to plan.   Labs Review Labs Reviewed - No data to display  Imaging Review No results found.   EKG Interpretation None      MDM   Final diagnoses:  Yellow jacket sting, accidental or unintentional, initial encounter    Patient presents with a presumed yellow jacket sting approximately 12 hours ago. He is nontoxic. He has no signs or symptoms of anaphylaxis.  He does have significant localized swelling of the left hand. He has not used any cold compresses and has only taken one dose of Benadryl which she states did not help. Patient was given Pepcid, Atarax, and prednisone given the significance of the swelling of the left hand. Ice pack was applied. Patient was instructed to continue supportive management with cold compresses and symptomatic care. Will place on steroid burst given persistence of swelling and worsening at 12 hours.  After history, exam, and medical workup I feel the patient has been appropriately medically screened and is safe for discharge home. Pertinent diagnoses were discussed with the patient. Patient was given return precautions.  I personally performed the services described in this documentation, which was scribed in my presence. The recorded information has been reviewed and is accurate.     Merryl Hacker, MD 03/18/14 0010

## 2014-03-17 NOTE — ED Notes (Signed)
Patient was stung this morning on the right hand.  Presents tonight with swelling to right hand.

## 2014-03-18 DIAGNOSIS — T6391XA Toxic effect of contact with unspecified venomous animal, accidental (unintentional), initial encounter: Secondary | ICD-10-CM | POA: Diagnosis not present

## 2014-03-18 MED ORDER — FAMOTIDINE 20 MG PO TABS: 20.0000 mg | ORAL_TABLET | Freq: Every day | ORAL | Status: DC

## 2014-03-18 MED ORDER — PREDNISONE 20 MG PO TABS: 60.0000 mg | ORAL_TABLET | Freq: Every day | ORAL | Status: DC

## 2014-03-18 MED ORDER — HYDROXYZINE HCL 25 MG PO TABS: 25.0000 mg | ORAL_TABLET | Freq: Three times a day (TID) | ORAL | Status: DC | PRN

## 2014-03-18 NOTE — Discharge Instructions (Signed)

## 2017-12-16 ENCOUNTER — Encounter (INDEPENDENT_AMBULATORY_CARE_PROVIDER_SITE_OTHER): Payer: Self-pay | Admitting: *Deleted

## 2019-10-03 ENCOUNTER — Other Ambulatory Visit: Payer: Self-pay

## 2019-10-03 ENCOUNTER — Encounter: Payer: Self-pay | Admitting: Emergency Medicine

## 2019-10-03 ENCOUNTER — Ambulatory Visit
Admission: EM | Admit: 2019-10-03 | Discharge: 2019-10-03 | Disposition: A | Discharge status: Home / Self Care | Payer: BC Managed Care – PPO | Attending: Emergency Medicine | Admitting: Emergency Medicine

## 2019-10-03 DIAGNOSIS — B349 Viral infection, unspecified: Secondary | ICD-10-CM | POA: Diagnosis not present

## 2019-10-03 DIAGNOSIS — Z20822 Contact with and (suspected) exposure to covid-19: Secondary | ICD-10-CM | POA: Diagnosis not present

## 2019-10-03 LAB — POC SARS CORONAVIRUS 2 AG -  ED: SARS Coronavirus 2 Ag: NEGATIVE

## 2019-10-03 MED ORDER — ACETAMINOPHEN 325 MG PO TABS
975.0000 mg | ORAL_TABLET | Freq: Once | ORAL | Status: AC
  Administered 2019-10-03: 19:00:00 975 mg via ORAL

## 2019-10-03 MED ORDER — BENZONATATE 100 MG PO CAPS: 100.0000 mg | ORAL_CAPSULE | Freq: Three times a day (TID) | ORAL | 0 refills | Status: DC

## 2019-10-03 NOTE — ED Provider Notes (Signed)
[facility]    CSN: [id number] Arrival date & time: 10/03/19  1855      History   Chief Complaint Chief Complaint  Patient presents with  . Diarrhea    HPI Jeff Liu is a 59 y.o. male with history of hypertension, iron deficiency presenting for acute onset of feeling cold, chills, dry cough, sinus congestion and pressure since around 2:00 this afternoon.  States he took his temperature at work around 5: 97.77F.  Has not take anything for symptoms today.  Patient does have some generalized abdominal discomfort decreased appetite: Denies pain, nausea, vomiting.  Did have diarrhea today without blood or melena.  Patient underwent Covid testing 30 days ago due to exposure.  States he has had repeat exposure since then: "We have someone out right now for".  Patient denies chest pain, shortness of breath.  Past Medical History:  Diagnosis Date  . History of folic acid deficiency (non-anemic) 07/30/2011  . HTN (hypertension) 07/30/2011  . Hypertension   . Iron deficiency anemia   . Normocytic anemia 07/30/2011    Patient Active Problem List   Diagnosis Date Noted  . Normocytic anemia 07/30/2011  . HTN (hypertension) 07/30/2011  . History of folic acid deficiency (non-anemic) 07/30/2011    Past Surgical History:  Procedure Laterality Date  . COLONOSCOPY         Home Medications    Prior to Admission medications   Medication Sig Start Date End Date Taking? Authorizing Provider  aspirin EC 81 MG tablet Take 81 mg by mouth daily.   Yes [provider]  Multiple Vitamin (ONE-A-DAY MENS PO) Take by mouth.   Yes [provider]  Multiple Vitamins-Minerals (AIRBORNE GUMMIES PO) Take by mouth.   Yes [provider]  amLODipine (NORVASC) 2.5 MG tablet Take 10 mg by mouth daily.     [provider]  benzonatate (TESSALON) 100 MG capsule Take 1 capsule (100 mg total) by mouth every 8 (eight) hours. 10/03/19   Hall-Potvin, [place],  PA-C  famotidine (PEPCID) 20 MG tablet Take 1 tablet (20 mg total) by mouth daily. 03/18/14   Horton, Barbette Hair, MD  Ferrous Sulfate 83 MG TABS Take 83 mg by mouth daily.      [provider]  folic acid (FOLVITE) 1 MG tablet Take 800 mcg by mouth daily.      [provider]  hydrOXYzine (ATARAX/VISTARIL) 25 MG tablet Take 1 tablet (25 mg total) by mouth every 8 (eight) hours as needed for itching. 03/18/14   Horton, Barbette Hair, MD  ibuprofen (ADVIL,MOTRIN) 200 MG tablet Take 200 mg by mouth every 8 (eight) hours as needed.      [provider]  lisinopril (PRINIVIL,ZESTRIL) 20 MG tablet Take 30 mg by mouth daily.     [provider]  meclizine (ANTIVERT) 25 MG tablet Take 1 tablet (25 mg total) by mouth 3 (three) times daily as needed. 12/03/12   Veryl Speak, MD  predniSONE (DELTASONE) 20 MG tablet Take 3 tablets (60 mg total) by mouth daily with breakfast. 03/18/14   Horton, Barbette Hair, MD    Family History Family History  Problem Relation Age of Onset  . Diabetes Mother   . Cancer Father        leukemia  . Emphysema Brother     Social History Social History   Tobacco Use  . Smoking status: Never Smoker  . Smokeless tobacco: Never Used  Substance Use Topics  . Alcohol use:  Yes  . Drug use: No     Allergies   Sulfa drugs cross reactors   Review of Systems As per HPI   Physical Exam Triage Vital Signs ED Triage Vitals  Enc Vitals Group     BP 10/03/19 1901 (!) 145/81     Pulse Rate 10/03/19 1901 87     Resp 10/03/19 1901 18     Temp 10/03/19 1901 (!) 100.8 F (38.2 C)     Temp Source 10/03/19 1901 Temporal     SpO2 10/03/19 1901 94 %     Weight --      Height --      Head Circumference --      Peak Flow --      Pain Score 10/03/19 1903 4     Pain Loc --      Pain Edu? --      Excl. in [place]? --    No data found.  Updated Vital Signs BP (!) 145/81 (BP Location: Left Arm)   Pulse 87   Temp (!) 100.8 F (38.2 C) (Temporal)    Resp 18   SpO2 94%   Visual Acuity Right Eye Distance:   Left Eye Distance:   Bilateral Distance:    Right Eye Near:   Left Eye Near:    Bilateral Near:     Physical Exam Constitutional:      General: He is not in acute distress.    Appearance: He is ill-appearing. He is not toxic-appearing or diaphoretic.  HENT:     Head: Normocephalic and atraumatic.     Nose: Nose normal.     Mouth/Throat:     Mouth: Mucous membranes are moist.     Pharynx: Oropharynx is clear.  Eyes:     General: No scleral icterus.    Conjunctiva/sclera: Conjunctivae normal.     Pupils: Pupils are equal, round, and reactive to light.  Neck:     Comments: Trachea midline, negative JVD Cardiovascular:     Rate and Rhythm: Normal rate and regular rhythm.  Pulmonary:     Effort: Pulmonary effort is normal. No respiratory distress.     Breath sounds: No wheezing, rhonchi or rales.  Abdominal:     General: Abdomen is flat. Bowel sounds are normal. There is no distension.     Palpations: Abdomen is soft. There is no mass.     Tenderness: There is no abdominal tenderness. There is no right CVA tenderness, left CVA tenderness, guarding or rebound.  Musculoskeletal:     Cervical back: Neck supple. No tenderness.     Right lower leg: No edema.     Left lower leg: No edema.  Lymphadenopathy:     Cervical: No cervical adenopathy.  Skin:    General: Skin is warm.     Capillary Refill: Capillary refill takes less than 2 seconds.     Coloration: Skin is not jaundiced or pale.     Findings: No rash.  Neurological:     Mental Status: He is alert and oriented to person, place, and time.      UC Treatments / Results  Labs (all labs ordered are listed, but only abnormal results are displayed) Labs Reviewed  NOVEL CORONAVIRUS, NAA  POC SARS CORONAVIRUS 2 AG -  ED    EKG   Radiology No results found.  Procedures Procedures (including critical care time)  Medications Ordered in UC Medications    acetaminophen (TYLENOL) tablet 975 mg (975 mg Oral Given  10/03/19 1929)    Initial Impression / Assessment and Plan / UC Course  I have reviewed the triage vital signs and the nursing notes.  Pertinent labs & imaging results that were available during my care of the patient were reviewed by me and considered in my medical decision making (see chart for details).     Patient febrile, though nontoxic in office.  No acute respiratory distress.  POC Covid test done: Negative-PCR pending.  Patient given Tylenol in office which he tolerated well.  Reviewed supportive management as outlined below.  Return precautions discussed, patient verbalized understanding and is agreeable to plan. Final Clinical Impressions(s) / UC Diagnoses   Final diagnoses:  Exposure to COVID-19 virus  Viral illness     Discharge Instructions     Tessalon for cough. Start flonase, atrovent nasal spray for nasal congestion/drainage. You can use over the counter nasal saline rinse such as neti pot for nasal congestion. Keep hydrated, your urine should be clear to pale yellow in color. Tylenol/motrin for fever and pain. Monitor for any worsening of symptoms, chest pain, shortness of breath, wheezing, swelling of the throat, go to the emergency department for further evaluation needed.     ED Prescriptions    Medication Sig Dispense Auth. Provider   benzonatate (TESSALON) 100 MG capsule Take 1 capsule (100 mg total) by mouth every 8 (eight) hours. 21 capsule Hall-Potvin, [place], PA-C     PDMP not reviewed this encounter.   Neldon Mc Elko [place], [place] 10/03/19 1936

## 2019-10-03 NOTE — ED Triage Notes (Signed)
Pt presents to [facility] for assessment of generalized abdominal pain, nausea, diarrhea, chills starting at 2pm today.

## 2019-10-03 NOTE — Discharge Instructions (Addendum)

## 2019-10-05 LAB — NOVEL CORONAVIRUS, NAA: SARS-CoV-2, NAA: NOT DETECTED

## 2019-11-04 ENCOUNTER — Ambulatory Visit: Payer: 59 | Attending: Internal Medicine

## 2019-11-04 ENCOUNTER — Other Ambulatory Visit: Payer: Self-pay

## 2019-11-04 DIAGNOSIS — Z23 Encounter for immunization: Secondary | ICD-10-CM

## 2019-11-04 NOTE — Progress Notes (Signed)
   Covid-19 Vaccination Clinic  Name:  Jeff Liu    MRN: [id number] DOB: Jun 24, 1961  11/04/2019  [name] was observed post Covid-19 immunization for 15 minutes without incident. He was provided with Vaccine Information Sheet and instruction to access the V-Safe system.   [name] was instructed to call 911 with any severe reactions post vaccine: Marland Kitchen Difficulty breathing  . Swelling of face and throat  . A fast heartbeat  . A bad rash all over body  . Dizziness and weakness   Immunizations Administered    Name Date Dose VIS Date Route   Moderna COVID-19 Vaccine 11/04/2019 10:08 AM 0.5 mL 07/25/2019 Intramuscular   Manufacturer: Moderna   Lot: [id number]   [id number]

## 2019-12-06 ENCOUNTER — Ambulatory Visit: Payer: 59 | Attending: Internal Medicine

## 2019-12-06 DIAGNOSIS — Z23 Encounter for immunization: Secondary | ICD-10-CM

## 2019-12-06 NOTE — Progress Notes (Signed)
   Covid-19 Vaccination Clinic  Name:  Jeff Liu    MRN: [id number] DOB: 1961/04/05  12/06/2019  [name] was observed post Covid-19 immunization for 15 minutes without incident. He was provided with Vaccine Information Sheet and instruction to access the V-Safe system.   [name] was instructed to call 911 with any severe reactions post vaccine: Marland Kitchen Difficulty breathing  . Swelling of face and throat  . A fast heartbeat  . A bad rash all over body  . Dizziness and weakness   Immunizations Administered    Name Date Dose VIS Date Route   Moderna COVID-19 Vaccine 12/06/2019  2:31 PM 0.5 mL 07/25/2019 Intramuscular   Manufacturer: Moderna   Lot: [id number]   [id number]

## 2021-11-25 ENCOUNTER — Telehealth: Payer: Self-pay

## 2021-11-25 NOTE — Telephone Encounter (Signed)
NOTES SCANNED TO REFERRAL 

## 2021-11-26 ENCOUNTER — Encounter (HOSPITAL_BASED_OUTPATIENT_CLINIC_OR_DEPARTMENT_OTHER): Payer: Self-pay | Admitting: Cardiovascular Disease

## 2021-11-26 ENCOUNTER — Ambulatory Visit (HOSPITAL_BASED_OUTPATIENT_CLINIC_OR_DEPARTMENT_OTHER): Payer: BC Managed Care – PPO | Admitting: Cardiovascular Disease

## 2021-11-26 VITALS — BP 152/88 | HR 44 | Ht 73.0 in | Wt 219.0 lb

## 2021-11-26 DIAGNOSIS — I10 Essential (primary) hypertension: Secondary | ICD-10-CM | POA: Diagnosis not present

## 2021-11-26 DIAGNOSIS — R001 Bradycardia, unspecified: Secondary | ICD-10-CM | POA: Diagnosis not present

## 2021-11-26 DIAGNOSIS — R400 Somnolence: Secondary | ICD-10-CM

## 2021-11-26 DIAGNOSIS — I1A0 Resistant hypertension: Secondary | ICD-10-CM

## 2021-11-26 DIAGNOSIS — Z5181 Encounter for therapeutic drug level monitoring: Secondary | ICD-10-CM

## 2021-11-26 DIAGNOSIS — R0683 Snoring: Secondary | ICD-10-CM

## 2021-11-26 HISTORY — DX: Essential (primary) hypertension: I10

## 2021-11-26 HISTORY — DX: Resistant hypertension: I1A.0

## 2021-11-26 HISTORY — DX: Bradycardia, unspecified: R00.1

## 2021-11-26 MED ORDER — CHLORTHALIDONE 25 MG PO TABS: 25.0000 mg | ORAL_TABLET | Freq: Every day | ORAL | 3 refills | Status: DC

## 2021-11-26 NOTE — Assessment & Plan Note (Addendum)
He had bradycardia to the 30s on his ambulatory monitor.  He does not have any clear associated symptoms, though he does sometimes have some mild lightheadedness.  He also notes some lightheadedness that occurs more in the summer and likely when he is intravascular volume depleted.  We will stop bisoprolol as above.  Continue to monitor.  No indication for pacemaker at this time. ?

## 2021-11-26 NOTE — Progress Notes (Signed)
?Cardiology Office Note:   ? ?Date:  11/26/2021  ? ?ID:  Jeff Liu, DOB 1960/12/10, MRN [id number] ? ?PCP:  Sharilyn Sites, MD ?  ?[facility] Providers ?Cardiologist:  None    ? ?Referring MD: Sharilyn Sites, MD  ? ?No chief complaint on file. ?CC: HTN ? ?History of Present Illness:   ? ?Jeff Liu is a 61 y.o. male with a hx of hypertension, CKD III, anemia here for hypertension management. He saw cardiology at [facility] 11/07/21 for bradycardia. He wore a holter monitor that showed a minimum heart rate of 38 and average heart rate of 56 and maximum rate 108. The lowest heart rate occurred around 8 pm. He had some PACs AND PVCs. His bisoprolol was discontinued and he was continued on lisinopril, hydrochlorothiazide and amlodipine. His blood pressure at last visit was 122/76. He had an echo 10/2021 lvef 55-60% and grade 1 diastolic dysfunction. Renal artery doppler 2/23 was performed but results are not available. ? ?He reports that last Monday while in [place], a vessel burst in his right eye. He checked his blood pressure and it was 177/115. After rechecking, the blood pressure had risen to 200. No symptoms of dizziness or chest pain. He went to the urgent care and had a CAT scan. He was given clonidine and he took it twice. He was told to stop bisoprolol but is still taking it. ?He has a history of hypertension that started in his 79's. He adds that it has always been hard to control but it is worse in the winter because he sweats less. He also has a history of low heart rate that is always in the 40's. Notes he has some frequent swelling. ?He exercises by walking and lifting weights. He feels good while exercising. He eats a lot of takeout and recently stopped drinking caffeine. He does not take ibuprofen. He takes apple cider vinegar pills occasionally.  ?He has been told that he snores. He wakes up feeling rested sometimes and finds it easy to fall asleep during the day. He has never had a  sleep study. ?115/52- last night (wrist cuff) ?122/60- last night ?He notes that his blood pressure is normally high from noon-3 pm but is lower when he gets home. He takes 10 mg amlodipine around 9-10 at night. ?He reports he was light-headed yesterday but it did not last for a long time. Prior, he was walking around for a while and felt like he needed to sit down. Adds that it does not happen often. ? ?He denies chest pain, shortness of breath, palpitations, lightheadedness, headaches, syncope, LE edema, orthopnea, PND.  ? ?Past Medical History:  ?Diagnosis Date  ? 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 History of folic acid deficiency (non-anemic) 07/30/2011  ? HTN (hypertension) 07/30/2011  ? Hypertension   ? Iron deficiency anemia   ? Normocytic anemia 07/30/2011  ? Resistant hypertension 11/26/2021  ? ? ?Past Surgical History:  ?Procedure Laterality Date  ? COLONOSCOPY    ? ? ?Current Medications: ?Current Meds  ?Medication Sig  ? amLODipine (NORVASC) 10 MG tablet Take 1 tablet by mouth daily.  ? aspirin EC 81 MG tablet Take 81 mg by mouth daily.  ? chlorthalidone (HYGROTON) 25 MG tablet Take 1 tablet (25 mg total) by mouth daily.  ? lisinopril (ZESTRIL) 40 MG tablet Take 1 tablet by mouth daily.  ? Multiple Vitamin (ONE-A-DAY MENS PO) Take by mouth.  ? Multiple Vitamins-Minerals (AIRBORNE GUMMIES PO) Take by mouth.  ?  sildenafil (VIAGRA) 100 MG tablet Take 100 mg by mouth as needed.  ? tamsulosin (FLOMAX) 0.4 MG CAPS capsule Take 0.4 mg by mouth daily.  ? testosterone cypionate (DEPOTESTOSTERONE CYPIONATE) 200 MG/ML injection SMARTSIG:100 Milligram(s) IM Every 2 Weeks  ? tretinoin (RETIN-A) 0.1 % cream 1(ONE) APPLICATION(S) TOPICAL EVERY NIGHT AT BEDTIME TO BUMPS ON CHIN  ? [DISCONTINUED] bisoprolol-hydrochlorothiazide (ZIAC) 5-6.25 MG tablet Take 1 tablet by mouth daily.  ?  ? ?Allergies:   Sulfa drugs cross reactors  ? ?Social History  ? ?Socioeconomic History  ? Marital status: Married  ?  Spouse name: Not on file  ? Number  of children: Not on file  ? Years of education: Not on file  ? Highest education level: Not on file  ?Occupational History  ? Not on file  ?Tobacco Use  ? Smoking status: Never  ? Smokeless tobacco: Never  ?Substance and Sexual Activity  ? Alcohol use: Yes  ? Drug use: No  ? Sexual activity: Yes  ?Other Topics Concern  ? Not on file  ?Social History Narrative  ? Not on file  ? ?Social Determinants of Health  ? ?Financial Resource Strain: Low Risk   ? Difficulty of Paying Living Expenses: Not hard at all  ?Food Insecurity: No Food Insecurity  ? Worried About Charity fundraiser in the Last Year: Never true  ? Ran Out of Food in the Last Year: Never true  ?Transportation Needs: No Transportation Needs  ? Lack of Transportation (Medical): No  ? Lack of Transportation (Non-Medical): No  ?Physical Activity: Sufficiently Active  ? Days of Exercise per Week: 7 days  ? Minutes of Exercise per Session: 60 min  ?Stress: Not on file  ?Social Connections: Not on file  ?  ? ?Family History: ?The patient's family history includes Cancer in his father; Diabetes in his mother; Emphysema in his brother; Heart failure in his paternal grandfather; Hypertension in his cousin and father; Stroke in his cousin. ? ?ROS:   ?Please see the history of present illness.    ? All other systems reviewed and are negative. ? ?EKGs/Labs/Other Studies Reviewed:   ? ?The following studies were reviewed today: ?ECHO 11/07/21 ? ?The left ventricular size is normal.  ?There is normal left ventricular wall thickness.  ?LV ejection fraction = 55-60%.  ?Left ventricular systolic function is normal.  ?Left ventricular filling pattern is prolonged relaxation.  ?The right atrium is mildly dilated.  ?There was insufficient TR detected to calculate RV systolic pressure.  ?IVC size was mildly dilated. ? ?EKG:  EKG is  ordered today.  ?11/26/2021: The ekg ordered today demonstrates sinus bradycardia rate- 44 bpm ? ?Recent Labs: ?No results found for requested labs  within last 8760 hours.  ?Recent Lipid Panel ?No results found for: CHOL, TRIG, HDL, CHOLHDL, VLDL, LDLCALC, LDLDIRECT ? ? ?Risk Assessment/Calculations:   ?  ? ?    ? ?Physical Exam:   ? ?VS:  BP (!) 152/88 (BP Location: Left Arm, Patient Position: Sitting)   Pulse (!) 44   Ht '6\' 1"'$  (1.854 m)   Wt 219 lb (99.3 kg)   BMI 28.89 kg/m?    ? ?Wt Readings from Last 3 Encounters:  ?11/26/21 219 lb (99.3 kg)  ?03/17/14 215 lb (97.5 kg)  ?10/30/11 216 lb (98 kg)  ?  ? ?GEN:  Well nourished, well developed in no acute distress ?HEENT: Normal ?NECK: No JVD; No carotid bruits ?LYMPHATICS: No lymphadenopathy ?CARDIAC: RRR, no murmurs, rubs, gallops ?RESPIRATORY:  Clear to auscultation without rales, wheezing or rhonchi  ?ABDOMEN: Soft, non-tender, non-distended ?MUSCULOSKELETAL:  No edema; No deformity  ?SKIN: Warm and dry ?NEUROLOGIC:  Alert and oriented x 3 ?PSYCHIATRIC:  Normal affect  ? ?ASSESSMENT:   ? ?Change bisoprolol to chlorthalidone.  ?Keep blood pressure log ?Conduct sleep study ? ?PLAN:   ? ?In order of problems listed above: ? ?Resistant hypertension ?Blood pressure is poorly controlled despite being on multiple medications.  He had negative renal artery Dopplers.  He also has bradycardia that is minimally symptomatic.  We will stop the bisoprolol/HCTZ.  Switch him to chlorthalidone 25 mg daily, which should be better given his renal dysfunction.  Continue lisinopril and amlodipine for now.  He is not having any side effects from either. ? ?Screening for Secondary Hypertension:   ? ?  11/26/2021  ? 11:39 AM  ?Causes  ?Drugs/Herbals Screened  ?Renovascular HTN Screened  ?   - Comments Negative renal artery Doppler 2022  ?Sleep Apnea Screened  ?   - Comments Check sleep study given snoring and daytime somnolence  ?Thyroid Disease Screened  ?   - Comments Check TSH  ?Hyperaldosteronism Screened  ?   - Comments Hold lisinopril for 2 days, then check renal and and aldosterone levels.  ?Pheochromocytoma N/A   ?Cushing's Syndrome N/A  ?Hyperparathyroidism Screened  ?Coarctation of the Aorta Screened  ?   - Comments BP symmetric  ?Compliance Screened  ?  ?Relevant Labs/Studies: ? ?  Latest Ref Rng & Units 12/02/2012  ? 11:29 PM

## 2021-11-26 NOTE — Assessment & Plan Note (Signed)
Blood pressure is poorly controlled despite being on multiple medications.  He had negative renal artery Dopplers.  He also has bradycardia that is minimally symptomatic.  We will stop the bisoprolol/HCTZ.  Switch him to chlorthalidone 25 mg daily, which should be better given his renal dysfunction.  Continue lisinopril and amlodipine for now.  He is not having any side effects from either. ? ?Screening for Secondary Hypertension:   ? ?  11/26/2021  ? 11:39 AM  ?Causes  ?Drugs/Herbals Screened  ?Renovascular HTN Screened  ?   - Comments Negative renal artery Doppler 2022  ?Sleep Apnea Screened  ?   - Comments Check sleep study given snoring and daytime somnolence  ?Thyroid Disease Screened  ?   - Comments Check TSH  ?Hyperaldosteronism Screened  ?   - Comments Hold lisinopril for 2 days, then check renal and and aldosterone levels.  ?Pheochromocytoma N/A  ?Cushing's Syndrome N/A  ?Hyperparathyroidism Screened  ?Coarctation of the Aorta Screened  ?   - Comments BP symmetric  ?Compliance Screened  ?  ?Relevant Labs/Studies: ? ?  Latest Ref Rng & Units 12/02/2012  ? 11:29 PM 04/24/2011  ?  3:17 PM  ?Basic Labs  ?Sodium 135 - 145 mEq/L 139   140    ?Potassium 3.5 - 5.1 mEq/L 3.7   3.7    ?Creatinine 0.50 - 1.35 mg/dL 1.13   1.31    ?  ?  ? ?

## 2021-11-26 NOTE — Patient Instructions (Signed)
Medication Instructions:  ?STOP BISOPROLOL-HCT ? ?START CHLORTHALIDONE 25 MG DAILY  ?  ?Labwork: ?BMET/TSH IN 1 WEEK ? ?HOLD YOUR LISINOPRIL ON A Saturday AND Sunday THEN GO FOR RENIN/ALDOSTERONE LEVELS FIRST THING Monday MORNING. RESUME LISINOPRIL AFTERWARDS ?  ?Testing/Procedures: ?.Your physician has recommended that you have a sleep study. This test records several body functions during sleep, including: brain activity, eye movement, oxygen and carbon dioxide blood levels, heart rate and rhythm, breathing rate and rhythm, the flow of air through your mouth and nose, snoring, body muscle movements, and chest and belly movement. ?THE OFFICE WILL CALL YOU TO ARRANGE ONCE YOUR INSURANCE HAS BEEN REVIEWED. IF YOU DO NOT HEAR FROM THE OFFICE IN 2 WEEKS CALL TO FOLLOW UP  ?  ?Follow-Up: ?12/24/2021 9:00 AM WITH PHARM D  ?  ?Special Instructions:  ? ?MONITOR YOUR BLOOD PRESSURE TWICE A DAY, LOG IN THE BOOK PROVIDED. BRING THE BOOK AND YOUR BLOOD PRESSURE MACHINE TO YOUR FOLLOW UP IN 1 MONTH  ? ?DASH Eating Plan ?DASH stands for "Dietary Approaches to Stop Hypertension." The DASH eating plan is a healthy eating plan that has been shown to reduce high blood pressure (hypertension). It may also reduce your risk for type 2 diabetes, heart disease, and stroke. The DASH eating plan may also help with weight loss. ?What are tips for following this plan? ? ?General guidelines ?Avoid eating more than 2,300 mg (milligrams) of salt (sodium) a day. If you have hypertension, you may need to reduce your sodium intake to 1,500 mg a day. ?Limit alcohol intake to no more than 1 drink a day for nonpregnant women and 2 drinks a day for men. One drink equals 12 oz of beer, 5 oz of wine, or 1? oz of hard liquor. ?Work with your health care provider to maintain a healthy body weight or to lose weight. Ask what an ideal weight is for you. ?Get at least 30 minutes of exercise that causes your heart to beat faster (aerobic exercise) most days of  the week. Activities may include walking, swimming, or biking. ?Work with your health care provider or diet and nutrition specialist (dietitian) to adjust your eating plan to your individual calorie needs. ?Reading food labels ? ?Check food labels for the amount of sodium per serving. Choose foods with less than 5 percent of the Daily Value of sodium. Generally, foods with less than 300 mg of sodium per serving fit into this eating plan. ?To find whole grains, look for the word "whole" as the first word in the ingredient list. ?Shopping ?Buy products labeled as "low-sodium" or "no salt added." ?Buy fresh foods. Avoid canned foods and premade or frozen meals. ?Cooking ?Avoid adding salt when cooking. Use salt-free seasonings or herbs instead of table salt or sea salt. Check with your health care provider or pharmacist before using salt substitutes. ?Do not fry foods. Cook foods using healthy methods such as baking, boiling, grilling, and broiling instead. ?Cook with heart-healthy oils, such as olive, canola, soybean, or sunflower oil. ?Meal planning ?Eat a balanced diet that includes: ?5 or more servings of fruits and vegetables each day. At each meal, try to fill half of your plate with fruits and vegetables. ?Up to 6-8 servings of whole grains each day. ?Less than 6 oz of lean meat, poultry, or fish each day. A 3-oz serving of meat is about the same size as a deck of cards. One egg equals 1 oz. ?2 servings of low-fat dairy each day. ?A serving  of nuts, seeds, or beans 5 times each week. ?Heart-healthy fats. Healthy fats called Omega-3 fatty acids are found in foods such as flaxseeds and coldwater fish, like sardines, salmon, and mackerel. ?Limit how much you eat of the following: ?Canned or prepackaged foods. ?Food that is high in trans fat, such as fried foods. ?Food that is high in saturated fat, such as fatty meat. ?Sweets, desserts, sugary drinks, and other foods with added sugar. ?Full-fat dairy products. ?Do  not salt foods before eating. ?Try to eat at least 2 vegetarian meals each week. ?Eat more home-cooked food and less restaurant, buffet, and fast food. ?When eating at a restaurant, ask that your food be prepared with less salt or no salt, if possible. ?What foods are recommended? ?The items listed may not be a complete list. Talk with your dietitian about what dietary choices are best for you. ?Grains ?Whole-grain or whole-wheat bread. Whole-grain or whole-wheat pasta. Brown rice. Modena Morrow. Bulgur. Whole-grain and low-sodium cereals. Pita bread. Low-fat, low-sodium crackers. Whole-wheat flour tortillas. ?Vegetables ?Fresh or frozen vegetables (raw, steamed, roasted, or grilled). Low-sodium or reduced-sodium tomato and vegetable juice. Low-sodium or reduced-sodium tomato sauce and tomato paste. Low-sodium or reduced-sodium canned vegetables. ?Fruits ?All fresh, dried, or frozen fruit. Canned fruit in natural juice (without added sugar). ?Meat and other protein foods ?Skinless chicken or [place]. Ground chicken or [place]. Pork with fat trimmed off. Fish and seafood. Egg whites. Dried beans, peas, or lentils. Unsalted nuts, nut butters, and seeds. Unsalted canned beans. Lean cuts of beef with fat trimmed off. Low-sodium, lean deli meat. ?Dairy ?Low-fat (1%) or fat-free (skim) milk. Fat-free, low-fat, or reduced-fat cheeses. Nonfat, low-sodium ricotta or cottage cheese. Low-fat or nonfat yogurt. Low-fat, low-sodium cheese. ?Fats and oils ?Soft margarine without trans fats. Vegetable oil. Low-fat, reduced-fat, or light mayonnaise and salad dressings (reduced-sodium). Canola, safflower, olive, soybean, and sunflower oils. Avocado. ?Seasoning and other foods ?Herbs. Spices. Seasoning mixes without salt. Unsalted popcorn and pretzels. Fat-free sweets. ?What foods are not recommended? ?The items listed may not be a complete list. Talk with your dietitian about what dietary choices are best for you. ?Grains ?Baked  goods made with fat, such as croissants, muffins, or some breads. Dry pasta or rice meal packs. ?Vegetables ?Creamed or fried vegetables. Vegetables in a cheese sauce. Regular canned vegetables (not low-sodium or reduced-sodium). Regular canned tomato sauce and paste (not low-sodium or reduced-sodium). Regular tomato and vegetable juice (not low-sodium or reduced-sodium). Angie Fava. Olives. ?Fruits ?Canned fruit in a light or heavy syrup. Fried fruit. Fruit in cream or butter sauce. ?Meat and other protein foods ?Fatty cuts of meat. Ribs. Fried meat. Berniece Salines. Sausage. Bologna and other processed lunch meats. Salami. Fatback. Hotdogs. Bratwurst. Salted nuts and seeds. Canned beans with added salt. Canned or smoked fish. Whole eggs or egg yolks. Chicken or [place] with skin. ?Dairy ?Whole or 2% milk, cream, and half-and-half. Whole or full-fat cream cheese. Whole-fat or sweetened yogurt. Full-fat cheese. Nondairy creamers. Whipped toppings. Processed cheese and cheese spreads. ?Fats and oils ?Butter. Stick margarine. Lard. Shortening. Ghee. Bacon fat. Tropical oils, such as coconut, palm kernel, or palm oil. ?Seasoning and other foods ?Salted popcorn and pretzels. Onion salt, garlic salt, seasoned salt, table salt, and sea salt. Worcestershire sauce. Tartar sauce. Barbecue sauce. Teriyaki sauce. Soy sauce, including reduced-sodium. Steak sauce. Canned and packaged gravies. Fish sauce. Oyster sauce. Cocktail sauce. Horseradish that you find on the shelf. Ketchup. Mustard. Meat flavorings and tenderizers. Bouillon cubes. Hot sauce and Tabasco sauce. Premade  or packaged marinades. Premade or packaged taco seasonings. Relishes. Regular salad dressings. ?Where to find more information: ?National Heart, Lung, and Blood Institute: https://wilson-eaton.com/ ?American Heart Association: www.heart.org ?Summary ?The DASH eating plan is a healthy eating plan that has been shown to reduce high blood pressure (hypertension). It may also reduce  your risk for type 2 diabetes, heart disease, and stroke. ?With the DASH eating plan, you should limit salt (sodium) intake to 2,300 mg a day. If you have hypertension, you may need to reduce your sodium

## 2021-11-27 ENCOUNTER — Encounter (HOSPITAL_BASED_OUTPATIENT_CLINIC_OR_DEPARTMENT_OTHER): Payer: Self-pay | Admitting: Cardiovascular Disease

## 2021-11-27 ENCOUNTER — Telehealth: Payer: Self-pay | Admitting: Cardiovascular Disease

## 2021-11-27 ENCOUNTER — Other Ambulatory Visit: Payer: Self-pay | Admitting: Cardiovascular Disease

## 2021-11-27 DIAGNOSIS — I10 Essential (primary) hypertension: Secondary | ICD-10-CM

## 2021-11-27 DIAGNOSIS — R400 Somnolence: Secondary | ICD-10-CM

## 2021-11-27 DIAGNOSIS — R0683 Snoring: Secondary | ICD-10-CM

## 2021-11-27 MED ORDER — CHLORTHALIDONE 25 MG PO TABS
25.0000 mg | ORAL_TABLET | Freq: Every day | ORAL | 3 refills | Status: DC
Start: 2021-11-27 — End: 2022-12-09

## 2021-11-27 NOTE — Telephone Encounter (Signed)
?*  STAT* If patient is at the pharmacy, call can be transferred to refill team. ? ? ?1. Which medications need to be refilled? (please list name of each medication and dose if known) chlorthalidone (HYGROTON) 25 MG tablet ? ?2. Which pharmacy/location (including street and city if local pharmacy) is medication to be sent to? [id number] [place], [place] - [address], [address] [facility] [address] ? ?3. Do they need a 30 day or 90 day supply? 90  ?

## 2021-11-27 NOTE — Telephone Encounter (Signed)
Rx(s) sent to pharmacy electronically.  

## 2021-12-12 ENCOUNTER — Telehealth (HOSPITAL_BASED_OUTPATIENT_CLINIC_OR_DEPARTMENT_OTHER): Payer: Self-pay | Admitting: *Deleted

## 2021-12-12 LAB — BASIC METABOLIC PANEL
BUN/Creatinine Ratio: 17 (ref 10–24)
BUN: 35 mg/dL — ABNORMAL HIGH (ref 8–27)
CO2: 24 mmol/L (ref 20–29)
Calcium: 10.1 mg/dL (ref 8.6–10.2)
Chloride: 100 mmol/L (ref 96–106)
Creatinine, Ser: 2.06 mg/dL — ABNORMAL HIGH (ref 0.76–1.27)
Glucose: 99 mg/dL (ref 70–99)
Potassium: 4.5 mmol/L (ref 3.5–5.2)
Sodium: 140 mmol/L (ref 134–144)
eGFR: 36 mL/min/{1.73_m2} — ABNORMAL LOW (ref >59)

## 2021-12-12 LAB — TSH: TSH: 0.863 u[IU]/mL (ref 0.450–4.500)

## 2021-12-12 NOTE — Telephone Encounter (Signed)
-----   Message from Skeet Latch, MD sent at 12/12/2021 11:49 AM EDT ----- ?Thyroid function is normal.  His kidney function is still abnormal.  Slightly worse than his last labs in March.  Recommend reducing chlorthalidone to 12.'5mg'$ .  Keep tracking BP and bring log to PharmD appointment.  ?

## 2021-12-12 NOTE — Telephone Encounter (Signed)
Advised patient of lab results and medication change, verbalized understanding  ?

## 2021-12-18 LAB — ALDOSTERONE + RENIN ACTIVITY W/ RATIO
ALDOS/RENIN RATIO: 2.1 (ref 0.0–30.0)
ALDOSTERONE: 3.5 ng/dL (ref 0.0–30.0)
Renin: 1.692 ng/mL/hr (ref 0.167–5.380)

## 2021-12-24 ENCOUNTER — Encounter (HOSPITAL_BASED_OUTPATIENT_CLINIC_OR_DEPARTMENT_OTHER): Payer: Self-pay | Admitting: Pharmacist Clinician (PhC)/ Clinical Pharmacy Specialist

## 2021-12-24 ENCOUNTER — Ambulatory Visit (INDEPENDENT_AMBULATORY_CARE_PROVIDER_SITE_OTHER): Payer: BC Managed Care – PPO | Admitting: Pharmacist Clinician (PhC)/ Clinical Pharmacy Specialist

## 2021-12-24 VITALS — BP 133/80 | HR 60 | Ht 72.0 in | Wt 216.4 lb

## 2021-12-24 DIAGNOSIS — I10 Essential (primary) hypertension: Secondary | ICD-10-CM | POA: Diagnosis not present

## 2021-12-24 NOTE — Assessment & Plan Note (Signed)
Patient with hypertension, still not to goal, but improving.  Will do labs today to check renal function, as SCr almost doubled with addition of chlorthalidone.  Now on 1/2 tablet.  Would like to start with hydralazine, but patient feels that he is doing well and doesn't want changes.  Should his SCr still be elevated would suggest stopping chlorthalidone and adding hydralazine instead.   Will plan to see patient in 3 months, but may need to move up if medications change.   ?

## 2021-12-24 NOTE — Patient Instructions (Signed)
Return for a a follow up appointment in 3 months - we will reach out to you in the next month or so. ? ?Go to the lab today to check kidney function. ? ?Check your blood pressure at home daily (if able) and keep record of the readings. ? ?Take your BP meds as follows: ? Continue with current medications ? ?Bring all of your meds, your BP cuff and your record of home blood pressures to your next appointment.  Exercise as you?re able, try to walk approximately 30 minutes per day.  Keep salt intake to a minimum, especially watch canned and prepared boxed foods.  Eat more fresh fruits and vegetables and fewer canned items.  Avoid eating in fast food restaurants.  ? ? HOW TO TAKE YOUR BLOOD PRESSURE: ?Rest 5 minutes before taking your blood pressure. ? Don?t smoke or drink caffeinated beverages for at least 30 minutes before. ?Take your blood pressure before (not after) you eat. ?Sit comfortably with your back supported and both feet on the floor (don?t cross your legs). ?Elevate your arm to heart level on a table or a desk. ?Use the proper sized cuff. It should fit smoothly and snugly around your bare upper arm. There should be enough room to slip a fingertip under the cuff. The bottom edge of the cuff should be 1 inch above the crease of the elbow. ?Ideally, take 3 measurements at one sitting and record the average. ? ? ?

## 2021-12-24 NOTE — Progress Notes (Signed)
? ? ? ?12/24/2021 ?[name] ?05-Jun-1961 ?[id number] ? ? ?HPI:  Jeff Liu is a 61 y.o. male patient of Dr Oval Linsey who presents today for hypertension clinic evaluation.  In addition to hypertension, his medical history is significant only for anemia and bradycardia.  He was seen by Dr. Oval Linsey in April at which time his BP was noted to be 152/88, and patient reported readings as high as 200 and 115 respectively.  He noted hypertension first diagnosed when he was in his 14's, and has always been hard to control.   ? ?Repeat BMET today? SCr 1.13 to 2.06 (Cut chlorthalidone to 1/2 tab) ? ?Blood Pressure Goal:  130/80 ? ?Current Medications: chlorthalidone 12.5 mg qd - am, lisinopril 40 mg qd -am, amlodipine 10 mg qd -pm ? ?Family Hx: both parents mostly dad; mom still living at 29htn, had stroke, dm; father died at 75 leukemia; 6 siblings, most with hypertension - brother had double lung transplant, breast cancer; son 64, daughter 53, son 88 ? ?Social Hx: no, rare alcohol; coffee many mornings, [employer] Keurig - stopped recently ? ?Diet: cooking more at home; fish and chicken mostly (bgout - no red meat-pork); some vegetables, frozen, avoid canned ? ?Exercise: daily exercise - walks 3 mi/day on weekends, weekdays daily weight lifting, resistance 30 min/day; squats at night  ? ?Home BP readings: 36 readings this month (various times of day) average 138/91 ? (Range 112-161/71-106) ? ?Intolerances: no cardiac medication intolerances ? ?Labs: 12/11/21: Na 140, K 4.5, Glu 99, BN 32. SCr 2.06. GFR 36 ? ? ?Wt Readings from Last 3 Encounters:  ?12/24/21 216 lb 6.1 oz (98.1 kg)  ?11/26/21 219 lb (99.3 kg)  ?03/17/14 215 lb (97.5 kg)  ? ?BP Readings from Last 3 Encounters:  ?12/24/21 133/80  ?11/26/21 (!) 152/88  ?10/03/19 (!) 145/81  ? ?Pulse Readings from Last 3 Encounters:  ?12/24/21 60  ?11/26/21 (!) 44  ?10/03/19 87  ? ? ?Current Outpatient Medications  ?Medication Sig Dispense Refill  ? amLODipine  (NORVASC) 10 MG tablet Take 1 tablet by mouth daily.    ? chlorthalidone (HYGROTON) 25 MG tablet Take 1 tablet (25 mg total) by mouth daily. 90 tablet 3  ? lisinopril (ZESTRIL) 40 MG tablet Take 1 tablet by mouth daily.    ? Multiple Vitamin (ONE-A-DAY MENS PO) Take by mouth.    ? sildenafil (VIAGRA) 100 MG tablet Take 100 mg by mouth as needed.    ? tamsulosin (FLOMAX) 0.4 MG CAPS capsule Take 0.4 mg by mouth daily.    ? testosterone cypionate (DEPOTESTOSTERONE CYPIONATE) 200 MG/ML injection SMARTSIG:100 Milligram(s) IM Every 2 Weeks    ? tretinoin (RETIN-A) 0.1 % cream 1(ONE) APPLICATION(S) TOPICAL EVERY NIGHT AT BEDTIME TO BUMPS ON CHIN    ? aspirin EC 81 MG tablet Take 81 mg by mouth daily. (Patient not taking: Reported on 12/24/2021)    ? Multiple Vitamins-Minerals (AIRBORNE GUMMIES PO) Take by mouth. (Patient not taking: Reported on 12/24/2021)    ? ?No current facility-administered medications for this visit.  ? ? ?Allergies  ?Allergen Reactions  ? Sulfa Drugs Cross Reactors Hives  ? ? ?Past Medical History:  ?Diagnosis Date  ? 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 History of folic acid deficiency (non-anemic) 07/30/2011  ? HTN (hypertension) 07/30/2011  ? Hypertension   ? Iron deficiency anemia   ? Normocytic anemia 07/30/2011  ? Resistant hypertension 11/26/2021  ? ? ?Blood pressure 133/80, pulse 60, height 6' (1.829 m), weight 216 lb 6.1  oz (98.1 kg). ? ?HTN (hypertension) ?Patient with hypertension, still not to goal, but improving.  Will do labs today to check renal function, as SCr almost doubled with addition of chlorthalidone.  Now on 1/2 tablet.  Would like to start with hydralazine, but patient feels that he is doing well and doesn't want changes.  Should his SCr still be elevated would suggest stopping chlorthalidone and adding hydralazine instead.   Will plan to see patient in 3 months, but may need to move up if medications change.   ? ? ?Tommy Medal PharmD CPP [facility] ?[address] ?[place], [place] [address] ?[phone] ?

## 2021-12-25 LAB — BASIC METABOLIC PANEL
BUN/Creatinine Ratio: 17 (ref 10–24)
BUN: 28 mg/dL — ABNORMAL HIGH (ref 8–27)
CO2: 22 mmol/L (ref 20–29)
Calcium: 10.3 mg/dL — ABNORMAL HIGH (ref 8.6–10.2)
Chloride: 101 mmol/L (ref 96–106)
Creatinine, Ser: 1.67 mg/dL — ABNORMAL HIGH (ref 0.76–1.27)
Glucose: 100 mg/dL — ABNORMAL HIGH (ref 70–99)
Potassium: 4.7 mmol/L (ref 3.5–5.2)
Sodium: 140 mmol/L (ref 134–144)
eGFR: 47 mL/min/{1.73_m2} — ABNORMAL LOW (ref >59)

## 2022-01-12 ENCOUNTER — Ambulatory Visit (HOSPITAL_BASED_OUTPATIENT_CLINIC_OR_DEPARTMENT_OTHER): Payer: BC Managed Care – PPO | Admitting: Cardiovascular Disease

## 2022-01-12 DIAGNOSIS — R0683 Snoring: Secondary | ICD-10-CM

## 2022-01-12 DIAGNOSIS — R400 Somnolence: Secondary | ICD-10-CM

## 2022-01-12 DIAGNOSIS — I10 Essential (primary) hypertension: Secondary | ICD-10-CM

## 2022-01-16 ENCOUNTER — Ambulatory Visit (HOSPITAL_BASED_OUTPATIENT_CLINIC_OR_DEPARTMENT_OTHER): Payer: BC Managed Care – PPO | Attending: Cardiovascular Disease | Admitting: Cardiovascular Disease

## 2022-01-16 DIAGNOSIS — I10 Essential (primary) hypertension: Secondary | ICD-10-CM | POA: Insufficient documentation

## 2022-01-16 DIAGNOSIS — R0683 Snoring: Secondary | ICD-10-CM | POA: Insufficient documentation

## 2022-01-16 DIAGNOSIS — G4733 Obstructive sleep apnea (adult) (pediatric): Secondary | ICD-10-CM | POA: Insufficient documentation

## 2022-01-16 DIAGNOSIS — R400 Somnolence: Secondary | ICD-10-CM | POA: Diagnosis not present

## 2022-01-29 ENCOUNTER — Encounter (HOSPITAL_BASED_OUTPATIENT_CLINIC_OR_DEPARTMENT_OTHER): Payer: Self-pay | Admitting: Cardiovascular Disease

## 2022-01-29 NOTE — Procedures (Signed)
     Patient Name: Jeff Liu, Jeff Liu Date: 01/19/2022 Gender: Male D.O.B: [birth year] Age (years): 21 Referring Provider: Skeet Latch Height (inches): 80 Interpreting Physician: Shelva Majestic MD, ABSM Weight (lbs): 215 RPSGT: Jacolyn Reedy BMI: 29 MRN: [id number] Neck Size: 17.50  CLINICAL INFORMATION Sleep Study Type: HST  Indication for sleep study: snoring, resistant hypertension, daytime somnolence  Epworth Sleepiness Score: 12  SLEEP STUDY TECHNIQUE A multi-channel overnight portable sleep study was performed. The channels recorded were: nasal airflow, thoracic respiratory movement, and oxygen saturation with a pulse oximetry. Snoring was also monitored.  MEDICATIONS amLODipine (NORVASC) 10 MG tablet aspirin EC 81 MG tablet chlorthalidone (HYGROTON) 25 MG tablet lisinopril (ZESTRIL) 40 MG tablet Multiple Vitamin (ONE-A-DAY MENS PO) Multiple Vitamins-Minerals (AIRBORNE GUMMIES PO) sildenafil (VIAGRA) 100 MG tablet tamsulosin (FLOMAX) 0.4 MG CAPS capsule testosterone cypionate (DEPOTESTOSTERONE CYPIONATE) 200 MG/ML injection tretinoin (RETIN-A) 0.1 % cream  Patient self administered medications include: N/A.  SLEEP ARCHITECTURE Patient was studied for 491 minutes. The sleep efficiency was 100.0 % and the patient was supine for 0%. The arousal index was 0.0 per hour.  RESPIRATORY PARAMETERS The overall AHI was 14.2 per hour, with a central apnea index of 0 per hour.  The oxygen nadir was 76% during sleep. Time spent < 89% was 7.7 minutes.  CARDIAC DATA Mean heart rate during sleep was 59.6 bpm. Heart rate range 49 - 139 bpm.  IMPRESSIONS - Mild-moderate obstructive sleep apnea overall (AHI 14.2/h); however, the severity during REM sleep cannot be assessed on this study. - Severe oxygen desaturation was to a nadir of 76%. - Patient snored  83.8 minutes (17.1%) during the sleep.  DIAGNOSIS - Obstructive Sleep Apnea (G47.33) - Nocturnal  Hypoxemia (G47.36)  RECOMMENDATIONS - In this patient with significant cardiovascular co-morbidities recommend therapeutic CPAP titration to determine optimal pressure required to alleviate sleep disordered breathing. With his resistant hypertension and high liklihood for more severe OSA during REM sleep recommend an in-lab titration; if unable, then Auto-PAP with EPR of 3 at 7 - 20 cm of water. - Effort should be made to optimize nasal and oropharyngeal patency. - Avoid alcohol, sedatives and other CNS depressants that may worsen sleep apnea and disrupt normal sleep architecture. - Sleep hygiene should be reviewed to assess factors that may improve sleep quality. - Weight management and regular exercise should be initiated or continued. - Recommend a download and sleep clinic evaluationa after one month of therapy.   [Electronically signed] 01/29/2022 08:26 PM  Shelva Majestic MD, [facility], [place] [facility] [place], American Board of Sleep Medicine  NPI: [id number]  [place] PH: [phone]   FX: [phone] [place]

## 2022-02-03 ENCOUNTER — Encounter (HOSPITAL_BASED_OUTPATIENT_CLINIC_OR_DEPARTMENT_OTHER): Payer: Self-pay | Admitting: Cardiovascular Disease

## 2022-02-03 NOTE — Telephone Encounter (Signed)
Please advise 

## 2022-02-04 NOTE — Telephone Encounter (Signed)
Message sent to Erasmo Score CMA and she will be reaching out to patient

## 2022-02-05 ENCOUNTER — Telehealth: Payer: Self-pay | Admitting: *Deleted

## 2022-02-05 ENCOUNTER — Other Ambulatory Visit: Payer: Self-pay | Admitting: Cardiovascular Disease

## 2022-02-05 DIAGNOSIS — G4736 Sleep related hypoventilation in conditions classified elsewhere: Secondary | ICD-10-CM

## 2022-02-05 DIAGNOSIS — G4733 Obstructive sleep apnea (adult) (pediatric): Secondary | ICD-10-CM

## 2022-02-05 NOTE — Telephone Encounter (Signed)
Patient notified of HST results and recommendations. Although he agrees to proceed with the titration study he does not feel that he can wear a CPAP mask. He question me about the inspire device. I explained to him that this would require a surgical procedure. Also he will need to try CPAP first in order to qualify. Patient voiced understanding of what has been told to him. He had no other questions at this time. [employer] Auth approval # received for titration study.  [employer] order # [id number]. Valid dates  02/05/22 to 04/05/22.

## 2022-02-10 ENCOUNTER — Ambulatory Visit (HOSPITAL_BASED_OUTPATIENT_CLINIC_OR_DEPARTMENT_OTHER): Payer: BC Managed Care – PPO | Attending: Cardiovascular Disease | Admitting: Cardiovascular Disease

## 2022-02-10 DIAGNOSIS — Z79899 Other long term (current) drug therapy: Secondary | ICD-10-CM | POA: Diagnosis not present

## 2022-02-10 DIAGNOSIS — G4736 Sleep related hypoventilation in conditions classified elsewhere: Secondary | ICD-10-CM

## 2022-02-10 DIAGNOSIS — G4733 Obstructive sleep apnea (adult) (pediatric): Secondary | ICD-10-CM | POA: Diagnosis present

## 2022-02-23 ENCOUNTER — Telehealth: Payer: Self-pay | Admitting: *Deleted

## 2022-02-23 ENCOUNTER — Encounter (HOSPITAL_BASED_OUTPATIENT_CLINIC_OR_DEPARTMENT_OTHER): Payer: Self-pay | Admitting: Cardiovascular Disease

## 2022-02-23 NOTE — Procedures (Signed)
Patient Name: Jeff Liu, Jeff Liu Date: 02/10/2022 Gender: Male D.O.B: 1961-04-02 Age (years): 68 Referring Provider: Skeet Latch Height (inches): 72 Interpreting Physician: Shelva Majestic MD, ABSM Weight (lbs): 215 RPSGT: Laren Everts BMI: 29 MRN: [id number] Neck Size: 17.50  CLINICAL INFORMATION The patient is referred for a CPAP titration to treat sleep apnea.  Date of HST: 01/19/2022: AHI 14.2/h; O2 nadir 76%  SLEEP STUDY TECHNIQUE As per the AASM Manual for the Scoring of Sleep and Associated Events v2.3 (April 2016) with a hypopnea requiring 4% desaturations.  The channels recorded and monitored were frontal, central and occipital EEG, electrooculogram (EOG), submentalis EMG (chin), nasal and oral airflow, thoracic and abdominal wall motion, anterior tibialis EMG, snore microphone, electrocardiogram, and pulse oximetry. Continuous positive airway pressure (CPAP) was initiated at the beginning of the study and titrated to treat sleep-disordered breathing.  MEDICATIONS amLODipine (NORVASC) 10 MG tablet aspirin EC 81 MG tablet chlorthalidone (HYGROTON) 25 MG tablet lisinopril (ZESTRIL) 40 MG tablet Multiple Vitamin (ONE-A-DAY MENS PO) Multiple Vitamins-Minerals (AIRBORNE GUMMIES PO) sildenafil (VIAGRA) 100 MG tablet tamsulosin (FLOMAX) 0.4 MG CAPS capsule testosterone cypionate (DEPOTESTOSTERONE CYPIONATE) 200 MG/ML injection tretinoin (RETIN-A) 0.1 % cream Medications self-administered by patient taken the night of the study : [place] Comments added by technician: None Comments added by scorer: N/A  RESPIRATORY PARAMETERS Optimal PAP Pressure (cm): 9 AHI at Optimal Pressure (/hr): 0 Overall Minimal O2 (%): 88.0 Supine % at Optimal Pressure (%): 45 Minimal O2 at Optimal Pressure (%): 93.0   SLEEP ARCHITECTURE The study was initiated at 11:19:52 PM and ended at 5:20:38 AM.  Sleep onset time was 5.0 minutes and the sleep  efficiency was 78.7%%. The total sleep time was 284 minutes.  The patient spent 11.6%% of the night in stage N1 sleep, 67.1%% in stage N2 sleep, 0.0%% in stage N3 and 21.3% in REM.Stage REM latency was 57.0 minutes  Wake after sleep onset was 71.8. Alpha intrusion was absent. Supine sleep was 45.60%.  CARDIAC DATA The 2 lead EKG demonstrated sinus rhythm. The mean heart rate was 51.6 beats per minute. Other EKG findings include: None.  LEG MOVEMENT DATA The total Periodic Limb Movements of Sleep (PLMS) were 0. The PLMS index was 0.0. A PLMS index of <15 is considered normal in adults.  IMPRESSIONS - CPAP was initiated at 5 cm and was titrated to optimal PAP pressure at 9 cm of water (AHI 0, O2 nadir 93%). - Central sleep apnea was not noted during this titration (CAI = 1.5/h). - Mild oxygen desaturations to a nadir of 88.0% at 7 cm. - The patient snored with soft snoring volume during this titration study. Snoring resolved at 9 cm of water. - No cardiac abnormalities were observed during this study. - Clinically significant periodic limb movements were not noted during this study. Arousals associated with PLMs were rare.  DIAGNOSIS - Obstructive Sleep Apnea (G47.33)  RECOMMENDATIONS - Recommend an initial trial of CPAP Auto therapy with EPR of 3 at 8 - 13 cm H2O with heated humidification. A Medium size Philips Respironics Nasal Pillow Nuance Pro Gel mask was used for the titration. - Effort should be made to optimize nasal and oropharyngeal patency. - Avoid alcohol, sedatives and other CNS depressants that may worsen sleep apnea and disrupt normal sleep architecture. - Sleep hygiene should be reviewed to assess factors that may improve sleep quality. - Weight management and regular exercise should be initiated or continued. - Recommend a download and sleep  clinic evaluation after 4 weeks of therapy.   [Electronically signed] 02/23/2022 10:17 AM  Shelva Majestic MD, [facility],  [place], American Board of Sleep Medicine  NPI: [id number]  [place] PH: [phone]   FX: [phone] [place]

## 2022-02-23 NOTE — Telephone Encounter (Signed)
-----   Message from Troy Sine, MD sent at 02/23/2022 10:21 AM EDT ----- Mariann Laster, please notify pt and set up with DME for CPAP initiation.

## 2022-02-23 NOTE — Telephone Encounter (Signed)
Patient notified CPAP titration has been completed. Order for CPAP machine sent to [facility].

## 2022-03-10 ENCOUNTER — Encounter (HOSPITAL_BASED_OUTPATIENT_CLINIC_OR_DEPARTMENT_OTHER): Payer: BC Managed Care – PPO | Admitting: Cardiovascular Disease

## 2022-09-03 ENCOUNTER — Ambulatory Visit: Payer: BC Managed Care – PPO | Attending: Cardiovascular Disease | Admitting: Cardiovascular Disease

## 2022-09-03 ENCOUNTER — Encounter: Payer: Self-pay | Admitting: Cardiovascular Disease

## 2022-09-03 VITALS — BP 126/78 | HR 79 | Ht 72.0 in | Wt 219.2 lb

## 2022-09-03 DIAGNOSIS — I1A0 Resistant hypertension: Secondary | ICD-10-CM

## 2022-09-03 DIAGNOSIS — G4733 Obstructive sleep apnea (adult) (pediatric): Secondary | ICD-10-CM | POA: Diagnosis not present

## 2022-09-03 DIAGNOSIS — G4736 Sleep related hypoventilation in conditions classified elsewhere: Secondary | ICD-10-CM

## 2022-09-03 DIAGNOSIS — R400 Somnolence: Secondary | ICD-10-CM

## 2022-09-03 DIAGNOSIS — R0683 Snoring: Secondary | ICD-10-CM | POA: Diagnosis not present

## 2022-09-03 NOTE — Patient Instructions (Signed)
Medication Instructions:  Your physician recommends that you continue on your current medications as directed. Please refer to the Current Medication list given to you today.  *If you need a refill on your cardiac medications before your next appointment, please call your pharmacy*   Lab Work: NONE If you have labs (blood work) drawn today and your tests are completely normal, you will receive your results only by: [place] (if you have MyChart) OR A paper copy in the mail If you have any lab test that is abnormal or we need to change your treatment, we will call you to review the results.   Testing/Procedures: NONE   Follow-Up: At [facility], you and your health needs are our priority.  As part of our continuing mission to provide you with exceptional heart care, we have created designated Provider Care Teams.  These Care Teams include your primary Cardiologist (physician) and Advanced Practice Providers (APPs -  Physician Assistants and Nurse Practitioners) who all work together to provide you with the care you need, when you need it.  We recommend signing up for the patient portal called "MyChart".  Sign up information is provided on this After Visit Summary.  MyChart is used to connect with patients for Virtual Visits (Telemedicine).  Patients are able to view lab/test results, encounter notes, upcoming appointments, etc.  Non-urgent messages can be sent to your provider as well.   To learn more about what you can do with MyChart, go to [email or web address].    Your next appointment:   1 year(s)  Provider:   Shelva Majestic, MD   Other Instructions SLEEP CLINIC

## 2022-09-03 NOTE — Progress Notes (Signed)
Cardiology Office Note    Date:  09/14/2022   ID:  SHAQUILL ISEMAN, DOB 06/03/1961, MRN [id number]  PCP:  Dr. Dahlia Client, [facility]  Cardiologist:  Shelva Majestic, MD (sleep); Dr. Oval Linsey  New sleep consult referred by Dr. Oval Linsey following initiation of CPAP therapy for obstructive sleep apnea.   History of Present Illness:  MICAEL BARB is a 62 y.o. male who is followed by Dr. Skeet Latch in hypertension clinic.  He had remotely seen Dr. Sharilyn Sites for primary care and now sees Dr. Dahlia Client in [facility].  As a history of bradycardia for which she previously was evaluated at [facility] and on Holter monitor had a minimum heart rate of 38 bpm, average heart rate 56 and maximum rate 108.  He had PACs and PVCs.  At that time bisoprolol was discontinued and he was continued on lisinopril, HCTZ and amlodipine for blood pressure control.  Renal Doppler studies apparently were performed and an echo Doppler study showed EF 55 to 60% with grade 1 diastolic dysfunction.  Most recently, he has been on chlorthalidone 12.5 mg, amlodipine 10 mg, and lisinopril 40 mg daily.  Due to concerns for obstructive sleep apnea with symptoms of snoring, initial resistant hypertension, and daytime sleepiness with an Epworth Sleepiness Scale score of 12 he underwent a home sleep study on Jan 19, 2022.  This revealed mild to moderate overall sleep apnea with an AHI of 14.2/h.  There was severe oxygen desaturation to nadir of 76% and he snored for 83.8 minutes or 17.1% of his sleep.  He subsequently underwent a CPAP titration study on February 10, 2022.  CPAP was initiated at 5 and was titrated only up to 9 cm of water.  AHI was 0 with O2 nadir 93%.  Was recommended that he initiate CPAP therapy with an auto range of 8 to 13 cm of water.  He received a new ResMed AirSense 11 AutoSet unit on June 29, 2022 with [employer] to his DME company.  Difficult he goes to bed at 11 and midnight and wakes up at 6:45 AM.  Getting  CPAP he has felt improved.  Download was obtained in the office today from November 6 through July 28, 2022.  Compliance is excellent at 100%.  AHI is excellent at 0.9 and his 95th percentile pressure is 12.6 with maximum average pressure 12.9.  Subsequent download from December 12 through September 02, 2022 shows sleep duration with CPAP at 7 hours and 38 minutes.  AHI is 0.7.  There is no mask leak.  He still admits to some mild residual daytime sleepiness and an Epworth scale was recalculated today endorsing at 12.  He notes improvement in previous snoring as well as nocturia.  He presents for his initial sleep consultation and evaluation.   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     amLODipine (NORVASC) 10 MG tablet Take 1 tablet by mouth daily.     chlorthalidone (HYGROTON) 25 MG tablet Take 1 tablet (25 mg total) by mouth  daily. (Patient taking differently: Take 12.5 mg by mouth daily.) 90 tablet 3   colchicine 0.6 MG tablet Take 0.6 mg by mouth as needed (gout).     lisinopril (ZESTRIL) 40 MG tablet Take 1 tablet by mouth daily.     Multiple Vitamin (ONE-A-DAY MENS PO) Take by mouth.     Multiple Vitamins-Minerals (AIRBORNE GUMMIES PO) Take by mouth.     sildenafil (VIAGRA) 100 MG tablet Take 100 mg by mouth as needed.     tamsulosin (FLOMAX) 0.4 MG CAPS capsule Take 0.4 mg by mouth daily.     testosterone cypionate (DEPOTESTOSTERONE CYPIONATE) 200 MG/ML injection SMARTSIG:100 Milligram(s) IM Every 2 Weeks     tretinoin (RETIN-A) 0.1 %  cream 1(ONE) APPLICATION(S) TOPICAL EVERY NIGHT AT BEDTIME TO BUMPS ON CHIN     aspirin EC 81 MG tablet Take 81 mg by mouth daily. (Patient not taking: Reported on 12/24/2021)     No facility-administered medications prior to visit.     Allergies:   Sulfa drugs cross reactors   Social History   Socioeconomic History   Marital status: Married    Spouse name: Not on file   Number of children: Not on file   Years of education: Not on file   Highest education level: Not on file  Occupational History   Not on file  Tobacco Use   Smoking status: Never   Smokeless tobacco: Never  Substance and Sexual Activity   Alcohol use: Yes   Drug use: No   Sexual activity: Yes  Other Topics Concern   Not on file  Social History Narrative   Not on file   Social Determinants of Health   Financial Resource Strain: Low Risk  (11/26/2021)   Overall Financial Resource Strain (CARDIA)    Difficulty of Paying Living Expenses: Not hard at all  Food Insecurity: No Food Insecurity (11/26/2021)   Hunger Vital Sign    Worried About Running Out of Food in the Last Year: Never true    [place] in the Last Year: Never true  Transportation Needs: No Transportation Needs (11/26/2021)   PRAPARE - Hydrologist (Medical): No    Lack of Transportation (Non-Medical): No  Physical Activity: Sufficiently Active (11/26/2021)   Exercise Vital Sign    Days of Exercise per Week: 7 days    Minutes of Exercise per Session: 60 min  Stress: Not on file  Social Connections: Not on file    Social history is notable and that he lives in [place].  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  He is married for 39 years.  He has 3 children ages 36, 80 and 61.  He has 2 grandchildren ages 64 and 15.  Family History:  The patient's family history includes Cancer in his father; Diabetes in his mother; Emphysema in his brother; Heart failure in his paternal grandfather; Hypertension  in his cousin and father; Stroke in his cousin.  He has 4 brothers and 2 sisters.  1 brother has COPD 1 sister has obstructive sleep apnea.  ROS General: Negative; No fevers, chills, or night sweats;  HEENT: Negative; No changes in vision or hearing, sinus congestion, difficulty swallowing Pulmonary: Negative; No cough, wheezing, shortness of breath, hemoptysis Cardiovascular: Negative; No chest pain, presyncope, syncope, palpitations GI: Negative; No nausea, vomiting, diarrhea, or abdominal pain GU: Negative; No dysuria, hematuria, or difficulty voiding Musculoskeletal: Negative; no myalgias, joint pain, or weakness Hematologic/Oncology: Negative; no easy  bruising, bleeding Endocrine: Negative; no heat/cold intolerance; no diabetes Neuro: Negative; no changes in balance, headaches Skin: Negative; No rashes or skin lesions Psychiatric: Negative; No behavioral problems, depression Sleep: The HPI: On CPAP therapy since June 29, 2022.  No residual snoring, bruxism, restless legs, hypnogognic hallucinations, no cataplexy Other comprehensive 14 point system review is negative.   PHYSICAL EXAM:   VS:  BP 126/78 (BP Location: Left Arm, Patient Position: Sitting, Cuff Size: Large)   Pulse 79   Ht 6' (1.829 m)   Wt 219 lb 3.2 oz (99.4 kg)   SpO2 97%   BMI 29.73 kg/m     BP blood pressure by me was 120/80.  Wt Readings from Last 3 Encounters:  09/03/22 219 lb 3.2 oz (99.4 kg)  02/10/22 215 lb (97.5 kg)  01/20/22 215 lb (97.5 kg)    General: Alert, oriented, no distress.  Skin: normal turgor, no rashes, warm and dry HEENT: Normocephalic, atraumatic. Pupils equal round and reactive to light; sclera anicteric; extraocular muscles intact;  Nose without nasal septal hypertrophy Mouth/Parynx benign; Mallinpatti scale 4 Neck: No JVD, no carotid bruits; normal carotid upstroke Lungs: clear to ausculatation and percussion; no wheezing or rales Chest wall: without tenderness to  palpitation Heart: PMI not displaced, RRR, s1 s2 normal, 1/6 systolic murmur, no diastolic murmur, no rubs, gallops, thrills, or heaves Abdomen: soft, nontender; no hepatosplenomehaly, BS+; abdominal aorta nontender and not dilated by palpation. Back: no CVA tenderness Pulses 2+ Musculoskeletal: full range of motion, normal strength, no joint deformities Extremities: no clubbing cyanosis or edema, Homan's sign negative  Neurologic: grossly nonfocal; Cranial nerves grossly wnl Psychologic: Normal mood and affect   Studies/Labs Reviewed:   I personally reviewed the ECG from November 26, 2021 which showed marked sinus bradycardia at 44 bpm mild early repolarization changes.  Recent Labs:    Latest Ref Rng & Units 12/24/2021    9:44 AM 12/11/2021   10:55 AM 12/02/2012   11:29 PM  BMP  Glucose 70 - 99 mg/dL 100  99  162   BUN 8 - 27 mg/dL 28  35  11   Creatinine 0.76 - 1.27 mg/dL 1.67  2.06  1.13   BUN/Creat Ratio 10 - '24 17  17    '$ Sodium 134 - 144 mmol/L 140  140  139   Potassium 3.5 - 5.2 mmol/L 4.7  4.5  3.7   Chloride 96 - 106 mmol/L 101  100  100   CO2 20 - 29 mmol/L '22  24  30   '$ Calcium 8.6 - 10.2 mg/dL 10.3  10.1  9.5         Latest Ref Rng & Units 12/02/2012   11:29 PM 04/24/2011    3:17 PM  Hepatic Function  Total Protein 6.0 - 8.3 g/dL 7.6  7.9   Albumin 3.5 - 5.2 g/dL 3.9  4.0   AST 0 - 37 U/L 28  23   ALT 0 - 53 U/L 16  15   Alk Phosphatase 39 - 117 U/L 67  61   Total Bilirubin 0.3 - 1.2 mg/dL 0.7  0.9        Latest Ref Rng & Units 12/02/2012   11:29 PM 10/30/2011    9:48 AM 07/24/2011    8:48 AM  CBC  WBC 4.0 - 10.5 K/uL 6.1  6.3  4.6   Hemoglobin 13.0 - 17.0 g/dL 12.1  12.1  12.4   Hematocrit 39.0 - 52.0 % 36.0  36.5  37.6   Platelets 150 - 400 K/uL 233  309  252    Lab Results  Component Value Date   MCV 89.6 12/02/2012   MCV 90.3 10/30/2011   MCV 91.3 07/24/2011   Lab Results  Component Value Date   TSH 0.863 12/11/2021   No results found for:  "HGBA1C"   BNP No results found for: "BNP"  ProBNP No results found for: "PROBNP"   Lipid Panel  No results found for: "CHOL", "TRIG", "HDL", "CHOLHDL", "VLDL", "LDLCALC", "LDLDIRECT", "LABVLDL"   RADIOLOGY: No results found.   Additional studies/ records that were reviewed today include:     Patient Name: Colm, Lyford Date: 01/19/2022 Gender: Male D.O.B: [birth year] Age (years): 78 Referring Provider: Skeet Latch Height (inches): 20 Interpreting Physician: Shelva Majestic MD, ABSM Weight (lbs): 215 RPSGT: Jacolyn Reedy BMI: 29 MRN: [id number] Neck Size: 17.50   CLINICAL INFORMATION Sleep Study Type: HST   Indication for sleep study: snoring, resistant hypertension, daytime somnolence   Epworth Sleepiness Score: 12   SLEEP STUDY TECHNIQUE A multi-channel overnight portable sleep study was performed. The channels recorded were: nasal airflow, thoracic respiratory movement, and oxygen saturation with a pulse oximetry. Snoring was also monitored.   MEDICATIONS amLODipine (NORVASC) 10 MG tablet aspirin EC 81 MG tablet chlorthalidone (HYGROTON) 25 MG tablet lisinopril (ZESTRIL) 40 MG tablet Multiple Vitamin (ONE-A-DAY MENS PO) Multiple Vitamins-Minerals (AIRBORNE GUMMIES PO) sildenafil (VIAGRA) 100 MG tablet tamsulosin (FLOMAX) 0.4 MG CAPS capsule testosterone cypionate (DEPOTESTOSTERONE CYPIONATE) 200 MG/ML injection tretinoin (RETIN-A) 0.1 % cream  Patient self administered medications include: N/A.   SLEEP ARCHITECTURE Patient was studied for 491 minutes. The sleep efficiency was 100.0 % and the patient was supine for 0%. The arousal index was 0.0 per hour.   RESPIRATORY PARAMETERS The overall AHI was 14.2 per hour, with a central apnea index of 0 per hour.   The oxygen nadir was 76% during sleep. Time spent < 89% was 7.7 minutes.   CARDIAC DATA Mean heart rate during sleep was 59.6 bpm. Heart rate range 49 - 139 bpm.   IMPRESSIONS -  Mild-moderate obstructive sleep apnea overall (AHI 14.2/h); however, the severity during REM sleep cannot be assessed on this study. - Severe oxygen desaturation was to a nadir of 76%. - Patient snored  83.8 minutes (17.1%) during the sleep.   DIAGNOSIS - Obstructive Sleep Apnea (G47.33) - Nocturnal Hypoxemia (G47.36)   RECOMMENDATIONS - In this patient with significant cardiovascular co-morbidities recommend therapeutic CPAP titration to determine optimal pressure required to alleviate sleep disordered breathing. With his resistant hypertension and high liklihood for more severe OSA during REM sleep recommend an in-lab titration; if unable, then Auto-PAP with EPR of 3 at 7 - 20 cm of water. - Effort should be made to optimize nasal and oropharyngeal patency. - Avoid alcohol, sedatives and other CNS depressants that may worsen sleep apnea and disrupt normal sleep architecture. - Sleep hygiene should be reviewed to assess factors that may improve sleep quality. - Weight management and regular exercise should be initiated or continued. - Recommend a download and sleep clinic evaluationa after one month of therapy.     CPAP TITRATION: 02/10/2022 CLINICAL INFORMATION The patient is referred for a CPAP titration to treat sleep apnea.   Date of HST: 01/19/2022: AHI 14.2/h; O2 nadir 76%   SLEEP STUDY TECHNIQUE As per the AASM Manual for the Scoring of Sleep and Associated Events v2.3 (April 2016) with a hypopnea requiring 4% desaturations.   The channels  recorded and monitored were frontal, central and occipital EEG, electrooculogram (EOG), submentalis EMG (chin), nasal and oral airflow, thoracic and abdominal wall motion, anterior tibialis EMG, snore microphone, electrocardiogram, and pulse oximetry. Continuous positive airway pressure (CPAP) was initiated at the beginning of the study and titrated to treat sleep-disordered breathing.   MEDICATIONS amLODipine (NORVASC) 10 MG tablet aspirin EC  81 MG tablet chlorthalidone (HYGROTON) 25 MG tablet lisinopril (ZESTRIL) 40 MG tablet Multiple Vitamin (ONE-A-DAY MENS PO) Multiple Vitamins-Minerals (AIRBORNE GUMMIES PO) sildenafil (VIAGRA) 100 MG tablet tamsulosin (FLOMAX) 0.4 MG CAPS capsule testosterone cypionate (DEPOTESTOSTERONE CYPIONATE) 200 MG/ML injection tretinoin (RETIN-A) 0.1 % cream Medications self-administered by patient taken the night of the study : [place] Comments added by technician: None Comments added by scorer: N/A   RESPIRATORY PARAMETERS Optimal PAP Pressure (cm):  9          AHI at Optimal Pressure (/hr):            0 Overall Minimal O2 (%):         88.0     Supine % at Optimal Pressure (%):    45 Minimal O2 at Optimal Pressure (%): 93.0        SLEEP ARCHITECTURE The study was initiated at 11:19:52 PM and ended at 5:20:38 AM.   Sleep onset time was 5.0 minutes and the sleep efficiency was 78.7%%. The total sleep time was 284 minutes.   The patient spent 11.6%% of the night in stage N1 sleep, 67.1%% in stage N2 sleep, 0.0%% in stage N3 and 21.3% in REM.Stage REM latency was 57.0 minutes   Wake after sleep onset was 71.8. Alpha intrusion was absent. Supine sleep was 45.60%.   CARDIAC DATA The 2 lead EKG demonstrated sinus rhythm. The mean heart rate was 51.6 beats per minute. Other EKG findings include: None.   LEG MOVEMENT DATA The total Periodic Limb Movements of Sleep (PLMS) were 0. The PLMS index was 0.0. A PLMS index of <15 is considered normal in adults.   IMPRESSIONS - CPAP was initiated at 5 cm and was titrated to optimal PAP pressure at 9 cm of water (AHI 0, O2 nadir 93%). - Central sleep apnea was not noted during this titration (CAI = 1.5/h). - Mild oxygen desaturations to a nadir of 88.0% at 7 cm. - The patient snored with soft snoring volume during this titration study. Snoring resolved at 9 cm of water. - No cardiac abnormalities were observed during this study. -  Clinically significant periodic limb movements were not noted during this study. Arousals associated with PLMs were rare.   DIAGNOSIS - Obstructive Sleep Apnea (G47.33)   RECOMMENDATIONS - Recommend an initial trial of CPAP Auto therapy with EPR of 3 at 8 - 13 cm H2O with heated humidification. A Medium size Philips Respironics Nasal Pillow Nuance Pro Gel mask was used for the titration. - Effort should be made to optimize nasal and oropharyngeal patency. - Avoid alcohol, sedatives and other CNS depressants that may worsen sleep apnea and disrupt normal sleep architecture. - Sleep hygiene should be reviewed to assess factors that may improve sleep quality. - Weight management and regular exercise should be initiated or continued. - Recommend a download and sleep clinic evaluation after 4 weeks of therapy.    ASSESSMENT:    1. OSA (obstructive sleep apnea)   2. Hypoxemia associated with sleep   3. Resistant hypertension   4. Snoring   5. Daytime somnolence  PLAN:  Antwian Santaana is a 62 year old gentleman who has a history of previous resistant hypertension now currently controlled on a regimen of low-dose pain 10 mg, chlorthalidone 12.5 mg, and lisinopril 40 mg daily.  Due to concerns for obstructive sleep apnea with symptoms of snoring, daytime sleepiness, good awakenings as well as nocturia, he was referred for a study and was found to have mild to moderate sleep apnea with an AHI of 14.2/h.  However he had significant oxygen desaturation to a nadir of 76%.  On subsequent CPAP titration he ultimately was titrated up to 9 cm of water and initial pressure set up was recommended at 8 to 13 cm of water.  Since initiating CPAP therapy he notes significant improvement in his sleep and has more energy and is unaware of any breakthrough snoring.  Previous nocturia has significantly improved.  I discussed with him the importance of optimal sleep duration at 7 and 9 hours.  I discussed normal  sleep architecture and the disruptive sleep architecture associated with obstructive sleep apnea.  I reviewed in detail the effect on cardiovascular health if patients have untreated sleep apnea with particular reference to its effects on hypertension, nocturnal arrhythmias with increased sympathetic tone contributing to palpitations as well as increased atrial fibrillation risk.  In particular I discussed often patients who have significant sleep apnea during REM sleep have greater likelihood of developing resistant hypertension.  I discussed its effects on insulin resistance contributing to increased glucose, GERD, as well as potential increased inflammation.  In addition we discussed nocturnal hypoxemia at contributing potentially to nocturnal ischemia both cardiac as well as cerebrovascular.  I also discussed the pathophysiology associated with frequent nocturia seen in patients with untreated sleep apnea and the benefit of therapy.  Presently, he feels well.  He has been using a fullface mask and is tolerating this well without any significant leak.  I will make mild adjustments to his current settings I will change his ramp start pressure from 4 up to 6 cm of water.  I will change his pressure setting to a range of 8 to 16 cm of water particularly with his 95th percentile pressure at 12.6 and maximum average pressure 12.9.  His blood pressure today is stable on current therapy.  He will follow-up with Dr. Oval Linsey for cardiovascular care.  He was appreciative of the time I spent with him today and the education provided.  As long as he is stable, I will see him in 1 year for reevaluation or sooner as needed.   Medication Adjustments/Labs and Tests Ordered: Current medicines are reviewed at length with the patient today.  Concerns regarding medicines are outlined above.  Medication changes, Labs and Tests ordered today are listed in the Patient Instructions below. Patient Instructions  Medication  Instructions:  Your physician recommends that you continue on your current medications as directed. Please refer to the Current Medication list given to you today.  *If you need a refill on your cardiac medications before your next appointment, please call your pharmacy*   Lab Work: NONE If you have labs (blood work) drawn today and your tests are completely normal, you will receive your results only by: [place] (if you have MyChart) OR A paper copy in the mail If you have any lab test that is abnormal or we need to change your treatment, we will call you to review the results.   Testing/Procedures: NONE   Follow-Up: At [facility], you and your health needs  are our priority.  As part of our continuing mission to provide you with exceptional heart care, we have created designated Provider Care Teams.  These Care Teams include your primary Cardiologist (physician) and Advanced Practice Providers (APPs -  Physician Assistants and Nurse Practitioners) who all work together to provide you with the care you need, when you need it.  We recommend signing up for the patient portal called "MyChart".  Sign up information is provided on this After Visit Summary.  MyChart is used to connect with patients for Virtual Visits (Telemedicine).  Patients are able to view lab/test results, encounter notes, upcoming appointments, etc.  Non-urgent messages can be sent to your provider as well.   To learn more about what you can do with MyChart, go to [email or web address].    Your next appointment:   1 year(s)  Provider:   Shelva Majestic, MD   Other Instructions SLEEP CLINIC     Signed, Shelva Majestic, MD, [facility], [place] Board of Sleep Medicine   09/14/2022 6:46 PM    [place] [address], [place], [place], [place]  [address] Phone: [phone]

## 2022-09-14 ENCOUNTER — Encounter: Payer: Self-pay | Admitting: Cardiovascular Disease

## 2022-12-09 ENCOUNTER — Other Ambulatory Visit: Payer: Self-pay | Admitting: Cardiovascular Disease

## 2022-12-09 NOTE — Telephone Encounter (Signed)
Rx(s) sent to pharmacy electronically.  

## 2023-11-22 ENCOUNTER — Ambulatory Visit (INDEPENDENT_AMBULATORY_CARE_PROVIDER_SITE_OTHER): Payer: BC Managed Care – PPO | Admitting: Family

## 2023-11-22 ENCOUNTER — Encounter (HOSPITAL_BASED_OUTPATIENT_CLINIC_OR_DEPARTMENT_OTHER): Payer: Self-pay | Admitting: Family

## 2023-11-22 VITALS — BP 128/82 | HR 52 | Ht 72.0 in | Wt 214.8 lb

## 2023-11-22 DIAGNOSIS — R001 Bradycardia, unspecified: Secondary | ICD-10-CM

## 2023-11-22 DIAGNOSIS — I10 Essential (primary) hypertension: Secondary | ICD-10-CM | POA: Diagnosis not present

## 2023-11-22 MED ORDER — AMLODIPINE BESYLATE 5 MG PO TABS: 5.0000 mg | ORAL_TABLET | Freq: Every day | ORAL | 3 refills | Status: AC

## 2023-11-22 NOTE — Patient Instructions (Signed)
 Medication Instructions:  Your physician has recommended you make the following change in your medication:   Decrease Amlodipine 5mg  daily   Follow-Up: At [facility], you and your health needs are our priority.  As part of our continuing mission to provide you with exceptional heart care, our providers are all part of one team.  This team includes your primary Cardiologist (physician) and Advanced Practice Providers or APPs (Physician Assistants and Nurse Practitioners) who all work together to provide you with the care you need, when you need it.  Your next appointment:   1 year with Dr. Duke Salvia

## 2023-11-22 NOTE — Progress Notes (Signed)
  Cardiology Office Note:  .   Date:  11/22/2023  ID:  Jeff Liu, DOB Aug 03, 1961, MRN [id number] PCP: Assunta Found, MD  [facility] Providers Cardiologist:  None    History of Present Illness: Marland Kitchen   Jeff Liu is a 63 y.o. male with hx of PAC, PVC, sinus bradycardia, hypertension, OSA, CKDIII, anemia.   Previously evaluated by Dr. Duke Salvia and Phillips Hay, [facility]. At visit 12/24/21 BP control was improving with clinic reading 133/80. Subsequent sleep study revealed OSA and CPAP titration performed.   [facility] 01/15/23 with bradycardia resolved after stopping bisoprolol.  Presents today for follow up independently. BP at home 106-120s. Rare reading >130 after having higher sodium. He notes some swelling in his bilateral ankles as well as his hands which he attributes to Amlodipine. He has started walking for exercise with successful 5 lbs weight loss. Has been working on adding healthier foods such as nuts, salmon and focusing on fruits and vegetables. No chest pain, exertional dyspnea.    ROS: Please see the history of present illness.    All other systems reviewed and are negative.   Studies Reviewed: .            Risk Assessment/Calculations:             Physical Exam:   VS:  BP 128/82 (BP Location: Left Arm, Patient Position: Sitting, Cuff Size: Large)   Pulse (!) 52   Ht 6' (1.829 m)   Wt 214 lb 12.8 oz (97.4 kg)   SpO2 98%   BMI 29.13 kg/m    Wt Readings from Last 3 Encounters:  11/22/23 214 lb 12.8 oz (97.4 kg)  09/03/22 219 lb 3.2 oz (99.4 kg)  02/10/22 215 lb (97.5 kg)    GEN: Well nourished, well developed in no acute distress NECK: No JVD; No carotid bruits CARDIAC: RRR, no murmurs, rubs, gallops RESPIRATORY:  Clear to auscultation without rales, wheezing or rhonchi  ABDOMEN: Soft, non-tender, non-distended EXTREMITIES:  Non pitting pretibial edema; No deformity   ASSESSMENT AND PLAN: .    HTN - BP at goal <130/80. Discussed to  monitor BP at home at least 2 hours after medications and sitting for 5-10 minutes. Due to LE edema, reduce Amlodipine from 10mg  to 5mg  daily. He will contact our office if home BP routinely >130/80. Continue Chlorthalidone 12.5mg  daily, Lisinopril 40mg  daily.  Recommend aiming for 150 minutes of moderate intensity activity per week and following a heart healthy diet.   Bradycardia - Asymptomatic with no lightheadedness nor dizziness. Bisoprolol previously discontinued.  OSA - CPAP compliance encouraged.        Dispo: follow up in 1 year with Dr. Duke Salvia or APP  Signed, Alver Sorrow, NP

## 2023-11-28 ENCOUNTER — Encounter (HOSPITAL_BASED_OUTPATIENT_CLINIC_OR_DEPARTMENT_OTHER): Payer: Self-pay | Admitting: Family

## 2024-01-14 ENCOUNTER — Other Ambulatory Visit (HOSPITAL_BASED_OUTPATIENT_CLINIC_OR_DEPARTMENT_OTHER): Payer: Self-pay

## 2024-01-14 MED ORDER — CHLORTHALIDONE 25 MG PO TABS: 12.5000 mg | ORAL_TABLET | Freq: Every day | ORAL | 1 refills | Status: AC

## 2024-01-14 NOTE — Telephone Encounter (Signed)
 Incoming fax from pharmacy requesting refill for Chlorthalidone  25 MG.

## 2024-08-01 ENCOUNTER — Encounter (HOSPITAL_BASED_OUTPATIENT_CLINIC_OR_DEPARTMENT_OTHER): Payer: Self-pay | Admitting: Cardiovascular Disease

## 2024-08-11 NOTE — Telephone Encounter (Signed)
 Would recommend check BP once per day and keep a log to  bring to follow up visit. We can protect kidney function through keeping blood pressure at goal <130/80.   Okay to offer 12/30 at 8:25 am or 12/31 at 2:45pm with me . Also could offer 09/21/24, multiple time slots available, with Kristin Alvstad, PharmD (our pharmacist who specializes in BP management).   Jd Mccaster S Kariana Wiles, NP
